# Patient Record
Sex: Female | Born: 1975 | Race: Black or African American | Hispanic: No | Marital: Married | Smoking: Never smoker
Health system: Southern US, Community
[De-identification: ages and names within clinical notes are randomized; demographics above are authoritative.]

## PROBLEM LIST (undated history)

## (undated) ENCOUNTER — Ambulatory Visit: Admission: EM | Payer: Managed Care, Other (non HMO) | Source: Home / Self Care

## (undated) DIAGNOSIS — R87629 Unspecified abnormal cytological findings in specimens from vagina: Secondary | ICD-10-CM

## (undated) DIAGNOSIS — Z803 Family history of malignant neoplasm of breast: Secondary | ICD-10-CM

## (undated) DIAGNOSIS — Z800 Family history of malignant neoplasm of digestive organs: Secondary | ICD-10-CM

## (undated) DIAGNOSIS — E079 Disorder of thyroid, unspecified: Secondary | ICD-10-CM

## (undated) DIAGNOSIS — Z8049 Family history of malignant neoplasm of other genital organs: Secondary | ICD-10-CM

## (undated) DIAGNOSIS — Z973 Presence of spectacles and contact lenses: Secondary | ICD-10-CM

## (undated) DIAGNOSIS — Z801 Family history of malignant neoplasm of trachea, bronchus and lung: Secondary | ICD-10-CM

## (undated) HISTORY — DX: Disorder of thyroid, unspecified: E07.9

## (undated) HISTORY — DX: Family history of malignant neoplasm of other genital organs: Z80.49

## (undated) HISTORY — DX: Family history of malignant neoplasm of trachea, bronchus and lung: Z80.1

## (undated) HISTORY — PX: CHOLECYSTECTOMY: SHX55

## (undated) HISTORY — DX: Unspecified abnormal cytological findings in specimens from vagina: R87.629

## (undated) HISTORY — DX: Family history of malignant neoplasm of breast: Z80.3

## (undated) HISTORY — PX: TUBAL LIGATION: SHX77

## (undated) HISTORY — PX: FLUOROSCOPIC TUBAL RECANNULATUON: SHX6654

## (undated) HISTORY — DX: Family history of malignant neoplasm of digestive organs: Z80.0

## (undated) HISTORY — PX: GALLBLADDER SURGERY: SHX652

---

## 1998-02-12 DIAGNOSIS — T8859XA Other complications of anesthesia, initial encounter: Secondary | ICD-10-CM

## 1998-02-12 HISTORY — DX: Other complications of anesthesia, initial encounter: T88.59XA

## 2007-02-13 HISTORY — PX: THYROIDECTOMY: SHX17

## 2008-02-13 HISTORY — PX: ABDOMINOPLASTY: SUR9

## 2011-02-13 DIAGNOSIS — G459 Transient cerebral ischemic attack, unspecified: Secondary | ICD-10-CM

## 2011-02-13 HISTORY — DX: Transient cerebral ischemic attack, unspecified: G45.9

## 2013-02-12 HISTORY — PX: ENDOMETRIAL ABLATION: SHX621

## 2017-06-26 DIAGNOSIS — E890 Postprocedural hypothyroidism: Secondary | ICD-10-CM | POA: Insufficient documentation

## 2017-06-26 DIAGNOSIS — Z6833 Body mass index (BMI) 33.0-33.9, adult: Secondary | ICD-10-CM | POA: Insufficient documentation

## 2017-06-26 DIAGNOSIS — Z9889 Other specified postprocedural states: Secondary | ICD-10-CM | POA: Insufficient documentation

## 2017-06-26 DIAGNOSIS — Z9049 Acquired absence of other specified parts of digestive tract: Secondary | ICD-10-CM | POA: Insufficient documentation

## 2017-07-02 DIAGNOSIS — N882 Stricture and stenosis of cervix uteri: Secondary | ICD-10-CM | POA: Insufficient documentation

## 2020-02-01 ENCOUNTER — Ambulatory Visit: Payer: Managed Care, Other (non HMO) | Admitting: Family Medicine

## 2020-02-01 ENCOUNTER — Encounter: Payer: Self-pay | Admitting: Family Medicine

## 2020-02-01 ENCOUNTER — Other Ambulatory Visit: Payer: Self-pay

## 2020-02-01 VITALS — BP 124/83 | HR 75 | Ht 62.0 in | Wt 187.0 lb

## 2020-02-01 DIAGNOSIS — N979 Female infertility, unspecified: Secondary | ICD-10-CM

## 2020-02-01 DIAGNOSIS — Z23 Encounter for immunization: Secondary | ICD-10-CM | POA: Diagnosis not present

## 2020-02-01 DIAGNOSIS — Z800 Family history of malignant neoplasm of digestive organs: Secondary | ICD-10-CM | POA: Diagnosis not present

## 2020-02-01 NOTE — Progress Notes (Signed)
    Subjective:    Patient ID: Kathryn Santos is a 44 y.o. female presenting with Establish Care  on 02/01/2020  HPI: She is a [id number] Here today to discuss fertility. She is s/p tubal reversal in 07/2019. She is also, s/p Novasure ablation in 2015 or so. She is having regular cycles that last 5 days. Her husband has no children. She might be interested in donor eggs. She has not had an HSG. Just turned 44. Has a lot of on-going nausea and symptoms of pregnancy. FSH, LH and AMH were all normal according to patient. Has been to infertility for tubal reversal. Was supposed to come in in December if not pregnant yet, for HSG.  Review of Systems  Constitutional: Negative for chills and fever.  Respiratory: Negative for shortness of breath.   Cardiovascular: Negative for chest pain.  Gastrointestinal: Negative for abdominal pain, nausea and vomiting.  Genitourinary: Negative for dysuria.  Skin: Negative for rash.      Objective:    BP 124/83   Pulse 75   Ht 5\' 2"  (1.575 m)   Wt 187 lb (84.8 kg)   LMP 01/26/2020 (Exact Date)   BMI 34.20 kg/m  Physical Exam Constitutional:      General: She is not in acute distress.    Appearance: She is well-developed and well-nourished.  HENT:     Head: Normocephalic and atraumatic.  Eyes:     General: No scleral icterus. Cardiovascular:     Rate and Rhythm: Normal rate.  Pulmonary:     Effort: Pulmonary effort is normal.  Abdominal:     Palpations: Abdomen is soft.  Musculoskeletal:     Cervical back: Neck supple.  Skin:    General: Skin is warm and dry.  Neurological:     Mental Status: She is alert and oriented to person, place, and time.  Psychiatric:        Mood and Affect: Mood and affect normal.         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 Discussed donor eggs.  She has an adult child who might be suitable.      Relevant Orders   DG Hysterogram (HSG)   Ambulatory referral to Endocrinology    Other Visit Diagnoses    Family history of colon cancer in father       Relevant Orders   Ambulatory referral to Gastroenterology   Need for influenza vaccination       Relevant Orders   Flu Vaccine QUAD 36+ mos IM (Fluarix, Quad PF) (Completed)      Total time in review of prior notes, pathology, labs, history taking, review with patient, exam, note writing, discussion of options, plan for next steps, alternatives and risks of treatment: 55 minutes.  Return in about 3 months (around 05/01/2020).  Donnamae Jude 02/02/2020 3:29 PM

## 2020-02-01 NOTE — Progress Notes (Signed)
Discuss getting pregnant and establish care Had a tubal reversal in June 2021 Has a uterine ablation  Last OB gave her clomid x 2 months   Last pap 2021-WNL mammo 2021

## 2020-02-02 ENCOUNTER — Encounter: Payer: Self-pay | Admitting: Family Medicine

## 2020-02-02 DIAGNOSIS — N979 Female infertility, unspecified: Secondary | ICD-10-CM | POA: Insufficient documentation

## 2020-02-02 NOTE — Assessment & Plan Note (Addendum)
Discussed age, risks of endometrial ablation, will check HSG, and try to eval cavity. Discussed donor eggs. She has an adult child who might be suitable.

## 2020-02-22 ENCOUNTER — Other Ambulatory Visit: Payer: Managed Care, Other (non HMO)

## 2020-02-22 ENCOUNTER — Other Ambulatory Visit: Payer: Self-pay

## 2020-02-22 DIAGNOSIS — Z3201 Encounter for pregnancy test, result positive: Secondary | ICD-10-CM

## 2020-02-23 LAB — BETA HCG QUANT (REF LAB): hCG Quant: <1 m[IU]/mL

## 2020-02-24 ENCOUNTER — Other Ambulatory Visit: Payer: Managed Care, Other (non HMO)

## 2020-02-24 ENCOUNTER — Other Ambulatory Visit: Payer: Self-pay

## 2020-02-24 ENCOUNTER — Encounter: Payer: Self-pay | Admitting: Radiology

## 2020-02-24 DIAGNOSIS — Z3201 Encounter for pregnancy test, result positive: Secondary | ICD-10-CM

## 2020-02-25 LAB — BETA HCG QUANT (REF LAB): hCG Quant: <1 m[IU]/mL

## 2020-03-01 ENCOUNTER — Encounter: Payer: Self-pay | Admitting: Family Medicine

## 2020-03-02 ENCOUNTER — Ambulatory Visit
Admission: RE | Admit: 2020-03-02 | Discharge: 2020-03-02 | Disposition: A | Discharge status: Home / Self Care | Payer: Managed Care, Other (non HMO) | Source: Ambulatory Visit | Attending: Family Medicine | Admitting: Family Medicine

## 2020-03-02 DIAGNOSIS — N979 Female infertility, unspecified: Secondary | ICD-10-CM

## 2020-03-16 DIAGNOSIS — Z8759 Personal history of other complications of pregnancy, childbirth and the puerperium: Secondary | ICD-10-CM | POA: Insufficient documentation

## 2020-03-16 DIAGNOSIS — Z8632 Personal history of gestational diabetes: Secondary | ICD-10-CM | POA: Insufficient documentation

## 2020-03-21 DIAGNOSIS — Z8759 Personal history of other complications of pregnancy, childbirth and the puerperium: Secondary | ICD-10-CM | POA: Insufficient documentation

## 2020-03-21 DIAGNOSIS — Z8719 Personal history of other diseases of the digestive system: Secondary | ICD-10-CM | POA: Insufficient documentation

## 2020-04-07 ENCOUNTER — Encounter: Payer: Self-pay | Admitting: Gastroenterology

## 2020-04-07 ENCOUNTER — Telehealth: Payer: Self-pay

## 2020-04-07 ENCOUNTER — Ambulatory Visit (INDEPENDENT_AMBULATORY_CARE_PROVIDER_SITE_OTHER): Payer: Managed Care, Other (non HMO) | Admitting: Gastroenterology

## 2020-04-07 VITALS — BP 128/90 | HR 89 | Ht 62.0 in | Wt 184.0 lb

## 2020-04-07 DIAGNOSIS — Z800 Family history of malignant neoplasm of digestive organs: Secondary | ICD-10-CM | POA: Diagnosis not present

## 2020-04-07 DIAGNOSIS — R140 Abdominal distension (gaseous): Secondary | ICD-10-CM | POA: Diagnosis not present

## 2020-04-07 DIAGNOSIS — R194 Change in bowel habit: Secondary | ICD-10-CM | POA: Diagnosis not present

## 2020-04-07 DIAGNOSIS — R110 Nausea: Secondary | ICD-10-CM

## 2020-04-07 MED ORDER — DICYCLOMINE HCL 10 MG PO CAPS: 10.0000 mg | ORAL_CAPSULE | Freq: Four times a day (QID) | ORAL | 1 refills | Status: DC | PRN

## 2020-04-07 MED ORDER — PANTOPRAZOLE SODIUM 40 MG PO TBEC: 40.0000 mg | DELAYED_RELEASE_TABLET | Freq: Every day | ORAL | 2 refills | Status: DC

## 2020-04-07 NOTE — Progress Notes (Signed)
Referring Provider: Donnamae Jude, MD Primary Care Physician:  Patient, No Pcp Per  Reason for Consultation:  Family history of colon cancer   IMPRESSION:  Family history of colon cancer (father at age 45) Near chronic nausea, bloating, and altered bowel habits  Cholecystectomy for symptomatic gallstones 2001  Colonoscopy recommended given the family history.    Differential for other symptoms is broad and includes peptic ulcer disease, gastroesophageal reflux, medications (nonsteroidal anti-inflammatory agents being the most common offender), biliary pain, gastroparesis, pancreatitis, carbohydrate malabsorption, celiac, bacterial overgrowth. Malignancy is less likely. Must also consider functional dyspepsia.     PLAN: Add a daily stool bulking agent such as Metamucil or Benefiber Trial of pantoprazole 40 mg QAM Dicyclomine 10 mg QID PRN Obtain prior records from [facility] in [place] EGD after resuming gluten free diet for at least 2 weeks Colonoscopy  Please see the "Patient Instructions" section for addition details about the plan.  HPI: Kathryn Santos is a 45 y.o. female referred by Dr. Kennon Rounds for a family history of colon cancer.  She has a history of hypothyroidism, nephrolithiasis, TIA, and cholecystectomy for symptomatic gallstones in 2001.  She is a Education officer, museum in [place]. Recently moved to [place].     Father diagnosed with colon cancer at age 21.  She had a colonoscopy in 2005 in [place], [place]. Thinks she may have had another one 5 years ago. She feels it's time for another colonoscopy.  Reports near constant nausea with associated bloating for at least 3 years. Awakes with symptoms, any then they occur intermittently throughout the day. Removed gluten with some improvement. Has tried an autoimmune diet. Has resulted in eating more fruits and vegetables. With this change she notes alternating bowel habits from stools like pebbles, to normal  shape and form, while some days she has severe postprandial diarrhea with associated urgency. She has had several accidents. Reminds her of the symptoms that she intiially experienced after her cholecystectomy. No blood or mucous in the stool.   Symptoms often worse after eating greasy foods. Has limited dairy in the event that lactose intolerance is contributing. Red meat results in constipation. Reduced caffeine intake, as well.   Has been married for 3 years. Trying to get pregnant but the nausea and bloating has complicated her efforts. They have recently decided to stop trying.   Cholecystectomy in 2001. Remembers being told that her pancreas didn't look normal. However, I reviewed the images of a CT abd/pelvis without contrast 04/27/19 that shows moderate stool in the colon but was otherwise normal.   No other known family history of colon cancer or polyps. No family history of uterine/endometrial cancer, pancreatic cancer or gastric/stomach cancer.   Past Medical History:  Diagnosis Date  . Thyroid disease   . Vaginal Pap smear, abnormal     Past Surgical History:  Procedure Laterality Date  . ABLATION    . FLUOROSCOPIC TUBAL RECANNULATUON    . GALLBLADDER SURGERY    . THYROIDECTOMY  02/2007  . TUBAL LIGATION      Current Outpatient Medications  Medication Sig Dispense Refill  . co-enzyme Q-10 30 MG capsule Take 30 mg by mouth 3 (three) times daily.    . Cyanocobalamin (VITAMIN B-12) 6000 MCG SUBL Place under the tongue.    . dicyclomine (BENTYL) 10 MG capsule Take 1 capsule (10 mg total) by mouth 4 (four) times daily as needed for spasms. 30 capsule 1  . pantoprazole (PROTONIX) 40 MG tablet Take  1 tablet (40 mg total) by mouth daily. 30 tablet 2  . prenatal vitamin w/FE, FA (PRENATAL 1 + 1) 27-1 MG TABS tablet Take 1 tablet by mouth daily at 12 noon.    . pyridoxine (B-6) 100 MG tablet Take 100 mg by mouth daily.     No current facility-administered medications for this  visit.    Allergies as of 04/07/2020 - Review Complete 04/07/2020  Allergen Reaction Noted  . Dilaudid [hydromorphone] Itching and Nausea And Vomiting 02/01/2020  . Zithromax [azithromycin] Hives and Other (See Comments) 02/01/2020    Family History  Problem Relation Age of Onset  . Hypertension Paternal Grandfather   . Cancer Paternal Grandfather        lung  . Diabetes Maternal Grandmother   . Diabetes Maternal Grandfather   . Hypertension Father   . Colon cancer Father 43  . Diabetes Maternal Aunt   . Diabetes Maternal Uncle   . Hypertension Paternal Uncle     Social History   Socioeconomic History  . Marital status: Married    Spouse name: Not on file  . Number of children: Not on file  . Years of education: Not on file  . Highest education level: Not on file  Occupational History  . Not on file  Tobacco Use  . Smoking status: Never Smoker  . Smokeless tobacco: Never Used  Vaping Use  . Vaping Use: Never used  Substance and Sexual Activity  . Alcohol use: Not Currently  . Drug use: Not Currently  . Sexual activity: Yes    Birth control/protection: None  Other Topics Concern  . Not on file  Social History Narrative  . Not on file   Social Determinants of Health   Financial Resource Strain: Not on file  Food Insecurity: Not on file  Transportation Needs: Not on file  Physical Activity: Not on file  Stress: Not on file  Social Connections: Not on file  Intimate Partner Violence: Not on file    Review of Systems: 12 system ROS is negative except as noted above with the addition of fatigue and excessive urination.   Physical Exam: General:   Alert,  well-nourished, pleasant and cooperative in NAD Head:  Normocephalic and atraumatic. Eyes:  Sclera clear, no icterus.   Conjunctiva pink. Ears:  Normal auditory acuity. Nose:  No deformity, discharge,  or lesions. Mouth:  No deformity or lesions.   Neck:  Supple; no masses or thyromegaly. Lungs:  Clear  throughout to auscultation.   No wheezes. Heart:  Regular rate and rhythm; no murmurs. Abdomen:  Soft, nontender, nondistended, normal bowel sounds, no rebound or guarding. No hepatosplenomegaly.   Rectal:  Deferred  Msk:  Symmetrical. No boney deformities LAD: No inguinal or umbilical LAD Extremities:  No clubbing or edema. Neurologic:  Alert and  oriented x4;  grossly nonfocal Skin:  Intact without significant lesions or rashes. Psych:  Alert and cooperative. Normal mood and affect.   Jaman Aro L. Tarri Glenn, MD, MPH 04/07/2020, 5:33 PM

## 2020-04-07 NOTE — Patient Instructions (Addendum)
It was a pleasure to meet you today. Based on our discussion, I am providing you with my recommendations below:  RECOMMENDATION(S):   I would like for your to try Pantoprazole and Dicyclomine to help control your symptoms. In addition, please take a daily stool bulking agent such as Metamucil or Benefiber.   I would like for you to sign a medical records release for me to review your previous records from [place], [place].   Finally, I am recommending an endoscopy and a colonoscopy due to your family's history for colon cancer.  PRESCRIPTION MEDICATION(S):   We have sent the following medication(s) to your pharmacy:  . Dicyclomine - please take 10mg  by mouth 4 times daily AS NEEDED . Pantoprazole - please take 40mg  by mouth every morning  NOTE: If your medication(s) requires a PRIOR AUTHORIZATION, we will receive notification from your pharmacy. Once received, the process to submit for approval may take up to 7-10 business days. You will be contacted about any denials we have received from your insurance company as well as alternatives recommended by your provider.  OVER THE COUNTER MEDICATION(S):   Please purchase the following medications over the counter and take as directed:  Marland Kitchen Metamucil or Benefiber - please take according to package instructions every day  RECORDS:   We will obtain records from [facility] located in [place], [place]:   . You have been scheduled for an endoscopy and a colonoscopy. Please follow written instructions given to you at your visit today.   PREP:   . Please pick up your prep supplies at the pharmacy within the next 1-3 days.  INHALERS:   . If you use inhalers (even only as needed), please bring them with you on the day of your procedure.  COLONOSCOPY TIPS:  . To reduce nausea and dehydration, stay well hydrated for 3-4 days prior to the exam.  . To prevent skin/hemorrhoid irritation - prior to wiping, put  A&Dointment or vaseline on the toilet paper. Marland Kitchen Keep a towel or pad on the bed.  Marland Kitchen BEFORE STARTING YOUR PREP, drink  64oz of clear liquids in the morning. This will help to flush the colon and will ensure you are well hydrated!!!!  NOTE - This is in addition to the fluids required for to complete your prep. . Use of a flavored hard candy, such as grape Anise Salvo, can counteract some of the flavor of the prep and may prevent some nausea.   BMI:  . If you are age 39 or younger, your body mass index should be between 19-25. Your There is no height or weight on file to calculate BMI. If this is out of the aformentioned range listed, please consider follow up with your Primary Care Provider.   Thank you for trusting me with your gastrointestinal care!    Thornton Park, MD, MPH

## 2020-04-07 NOTE — Progress Notes (Signed)
Records Request  Provider and/or Facility: [place], [place]  Document: Signed ROI  ROI has been faxed successfully to [place], [place]. Document(s) and fax confirmation(s) have been placed in the faxed file for future reference.

## 2020-04-07 NOTE — Telephone Encounter (Signed)
Received incoming fax from [facility] they could not locate pt by the name requested. Called pt to inquire further and to determine if these records might be under a maiden name or other alias. LVM requesting returned call.

## 2020-04-07 NOTE — Progress Notes (Signed)
Received incoming fax from [facility] they could not locate pt by the name requested. Called pt to inquire further and to determine if these records might be under a maiden name or other alias. LVM requesting returned call.

## 2020-04-08 ENCOUNTER — Telehealth: Payer: Self-pay

## 2020-04-08 NOTE — Telephone Encounter (Signed)
Received message from Dr. Tarri Glenn asking me to call pt and to change time of procedure from 2pm to 1:30pm on 04/15/20. LVM requesting returned call.

## 2020-04-08 NOTE — Progress Notes (Signed)
SECOND ATTEMPT:  Called pt to inquire about maiden name. States he records should be found under last "THOMAS". Advised we will resubmit with this information. Verbalized acceptance and understanding.  Request refaxed with above maiden name. Will await records.

## 2020-04-08 NOTE — Telephone Encounter (Signed)
SECOND ATTEMPT:  Called pt to inquire about maiden name. States he records should be found under last "THOMAS". Advised we will resubmit with this information. Verbalized acceptance and understanding.

## 2020-04-11 NOTE — Telephone Encounter (Signed)
SECOND ATTEMPT:  At Dr. Tarri Glenn request, called Kathryn Santos to inquire if she would be able to come in 30 minutes sooner at 1:30pm. Kathryn Santos confirmed she would be able to do so. Appt time changed per Dr. Tarri Glenn request. Kathryn Santos is aware to arrive at 12:30pm for her 1:30pm appt.

## 2020-04-15 ENCOUNTER — Other Ambulatory Visit: Payer: Self-pay

## 2020-04-15 ENCOUNTER — Ambulatory Visit (AMBULATORY_SURGERY_CENTER): Payer: Managed Care, Other (non HMO) | Admitting: Gastroenterology

## 2020-04-15 ENCOUNTER — Encounter: Payer: Self-pay | Admitting: Gastroenterology

## 2020-04-15 VITALS — BP 145/72 | HR 67 | Temp 97.1°F | Resp 19 | Ht 62.0 in | Wt 184.0 lb

## 2020-04-15 DIAGNOSIS — Z800 Family history of malignant neoplasm of digestive organs: Secondary | ICD-10-CM

## 2020-04-15 DIAGNOSIS — R194 Change in bowel habit: Secondary | ICD-10-CM

## 2020-04-15 DIAGNOSIS — K319 Disease of stomach and duodenum, unspecified: Secondary | ICD-10-CM | POA: Diagnosis not present

## 2020-04-15 DIAGNOSIS — K219 Gastro-esophageal reflux disease without esophagitis: Secondary | ICD-10-CM | POA: Diagnosis not present

## 2020-04-15 DIAGNOSIS — R110 Nausea: Secondary | ICD-10-CM

## 2020-04-15 MED ORDER — SODIUM CHLORIDE 0.9 % IV SOLN: 500.0000 mL | Freq: Once | INTRAVENOUS | Status: DC

## 2020-04-15 NOTE — Patient Instructions (Signed)
Impression/Recommendations:  Resume previous dietl Continue present medications. Await pathology results.  Repeat colonoscopy in 5 years for surveillance.  YOU HAD AN ENDOSCOPIC PROCEDURE TODAY AT [facility]:   Refer to the procedure report that was given to you for any specific questions about what was found during the examination.  If the procedure report does not answer your questions, please call your gastroenterologist to clarify.  If you requested that your care partner not be given the details of your procedure findings, then the procedure report has been included in a sealed envelope for you to review at your convenience later.  YOU SHOULD EXPECT: Some feelings of bloating in the abdomen. Passage of more gas than usual.  Walking can help get rid of the air that was put into your GI tract during the procedure and reduce the bloating. If you had a lower endoscopy (such as a colonoscopy or flexible sigmoidoscopy) you may notice spotting of blood in your stool or on the toilet paper. If you underwent a bowel prep for your procedure, you may not have a normal bowel movement for a few days.  Please Note:  You might notice some irritation and congestion in your nose or some drainage.  This is from the oxygen used during your procedure.  There is no need for concern and it should clear up in a day or so.  SYMPTOMS TO REPORT IMMEDIATELY:   Following lower endoscopy (colonoscopy or flexible sigmoidoscopy):  Excessive amounts of blood in the stool  Significant tenderness or worsening of abdominal pains  Swelling of the abdomen that is new, acute  Fever of 100F or higher   Following upper endoscopy (EGD)  Vomiting of blood or coffee ground material  New chest pain or pain under the shoulder blades  Painful or persistently difficult swallowing  New shortness of breath  Fever of 100F or higher  Black, tarry-looking stools  For urgent or emergent issues, a  gastroenterologist can be reached at any hour by calling [phone]. Do not use MyChart messaging for urgent concerns.    DIET:  We do recommend a small meal at first, but then you may proceed to your regular diet.  Drink plenty of fluids but you should avoid alcoholic beverages for 24 hours.  ACTIVITY:  You should plan to take it easy for the rest of today and you should NOT DRIVE or use heavy machinery until tomorrow (because of the sedation medicines used during the test).    FOLLOW UP: Our staff will call the number listed on your records 48-72 hours following your procedure to check on you and address any questions or concerns that you may have regarding the information given to you following your procedure. If we do not reach you, we will leave a message.  We will attempt to reach you two times.  During this call, we will ask if you have developed any symptoms of COVID 19. If you develop any symptoms (ie: fever, flu-like symptoms, shortness of breath, cough etc.) before then, please call [phone].  If you test positive for Covid 19 in the 2 weeks post procedure, please call and report this information to [place].    If any biopsies were taken you will be contacted by phone or by letter within the next 1-3 weeks.  Please call us at [phone] if you have not heard about the biopsies in 3 weeks.    SIGNATURES/CONFIDENTIALITY: You and/or your care partner have signed paperwork which will  be entered into your electronic medical record.  These signatures attest to the fact that that the information above on your After Visit Summary has been reviewed and is understood.  Full responsibility of the confidentiality of this discharge information lies with you and/or your care-partner. 

## 2020-04-15 NOTE — Op Note (Signed)
[facility] Patient Name: Kathryn Santos Procedure Date: 04/15/2020 1:42 PM MRN: [id number] Endoscopist: Thornton Park MD, MD Age: 45 Referring MD:  Date of Birth: October 24, 1975 Gender: Female Account #: [id number] Procedure:                Upper GI endoscopy Indications:              Chronic nausea and bloating Medicines:                Monitored Anesthesia Care Procedure:                Pre-Anesthesia Assessment:                           - Prior to the procedure, a History and Physical                            was performed, and patient medications and                            allergies were reviewed. The patient's tolerance of                            previous anesthesia was also reviewed. The risks                            and benefits of the procedure and the sedation                            options and risks were discussed with the patient.                            All questions were answered, and informed consent                            was obtained. Prior Anticoagulants: The patient has                            taken no previous anticoagulant or antiplatelet                            agents. ASA Grade Assessment: III - A patient with                            severe systemic disease. After reviewing the risks                            and benefits, the patient was deemed in                            satisfactory condition to undergo the procedure.                           After obtaining informed consent, the endoscope was  passed under direct vision. Throughout the                            procedure, the patient's blood pressure, pulse, and                            oxygen saturations were monitored continuously. The                            Endoscope was introduced through the mouth, and                            advanced to the third part of duodenum. The upper                            GI endoscopy was  accomplished without difficulty.                            The patient tolerated the procedure well. Scope In: Scope Out: Findings:                 The examined esophagus was normal except for mild                            congestion at the z-line. Biopsies were taken with                            a cold forceps for histology. Estimated blood loss                            was minimal.                           Patchy mildly erythematous mucosa without bleeding                            was found in the gastric body. Biopsies were taken                            from the antrum, body, and fundus with a cold                            forceps for histology. Estimated blood loss was                            minimal.                           The examined duodenum was normal. Biopsies were                            taken with a cold forceps for histology. Estimated                            blood  loss was minimal.                           The cardia and gastric fundus were normal on                            retroflexion.                           The exam was otherwise without abnormality. Complications:            No immediate complications. Estimated blood loss:                            Minimal. Estimated Blood Loss:     Estimated blood loss was minimal. Impression:               - Normal esophagus. Biopsied.                           - Erythematous mucosa in the gastric body. Biopsied.                           - Normal examined duodenum. Biopsied.                           - The examination was otherwise normal. Recommendation:           - Patient has a contact number available for                            emergencies. The signs and symptoms of potential                            delayed complications were discussed with the                            patient. Return to normal activities tomorrow.                            Written discharge instructions were  provided to the                            patient.                           - Resume previous diet.                           - Continue present medications.                           - Await pathology results.                           - Proceed with colonoscopy as previously planned. Thornton Park MD, MD 04/15/2020 2:06:34 PM This report has been signed electronically.

## 2020-04-15 NOTE — Op Note (Signed)
[place] Patient Name: Kathryn Santos Procedure Date: 04/15/2020 1:42 PM MRN: [id number] Endoscopist: Thornton Park MD, MD Age: 45 Referring MD:  Date of Birth: 05-Aug-1975 Gender: Female Account #: [id number] Procedure:                Colonoscopy Indications:              Screening in patient at increased risk: Family                            history of 1st-degree relative with colorectal                            cancer before age 52 years, Incidental change in                            bowel habits noted                           Father with colon cancer at age 55 Medicines:                Monitored Anesthesia Care Procedure:                Pre-Anesthesia Assessment:                           - Prior to the procedure, a History and Physical                            was performed, and patient medications and                            allergies were reviewed. The patient's tolerance of                            previous anesthesia was also reviewed. The risks                            and benefits of the procedure and the sedation                            options and risks were discussed with the patient.                            All questions were answered, and informed consent                            was obtained. Prior Anticoagulants: The patient has                            taken no previous anticoagulant or antiplatelet                            agents. ASA Grade Assessment: III - A patient with  severe systemic disease. After reviewing the risks                            and benefits, the patient was deemed in                            satisfactory condition to undergo the procedure.                           After obtaining informed consent, the colonoscope                            was passed under direct vision. Throughout the                            procedure, the patient's blood pressure, pulse, and                             oxygen saturations were monitored continuously. The                            Olympus CF-HQ190 (#[id number]) Colonoscope was                            introduced through the anus and advanced to the 3                            cm into the ileum. The colonoscopy was performed                            without difficulty. The patient tolerated the                            procedure well. The quality of the bowel                            preparation was good. The terminal ileum, ileocecal                            valve, appendiceal orifice, and rectum were                            photographed. Scope In: 1:54:30 PM Scope Out: 2:04:35 PM Scope Withdrawal Time: 0 hours 8 minutes 37 seconds  Total Procedure Duration: 0 hours 10 minutes 5 seconds  Findings:                 The perianal and digital rectal examinations were                            normal.                           The colon (entire examined portion) appeared  normal. Biopsies were taken from the right colon,                            transverse colon, and left colon with a cold                            forceps for histology. Estimated blood loss was                            minimal.                           The exam was otherwise without abnormality on                            direct and retroflexion views. Complications:            No immediate complications. Estimated blood loss:                            Minimal. Estimated Blood Loss:     Estimated blood loss was minimal. Impression:               - The entire examined colon is normal. Biopsied.                           - No polyp or mass seen. Recommendation:           - Patient has a contact number available for                            emergencies. The signs and symptoms of potential                            delayed complications were discussed with the                            patient. Return to normal  activities tomorrow.                            Written discharge instructions were provided to the                            patient.                           - Resume previous diet.                           - Continue present medications.                           - Await pathology results.                           - Repeat colonoscopy in 5 years for surveillance  given the family history.                           - Emerging evidence supports eating a diet of                            fruits, vegetables, grains, calcium, and yogurt                            while reducing red meat and alcohol may reduce the                            risk of colon cancer.                           - Thank you for allowing me to be involved in your                            colon cancer prevention. Thornton Park MD, MD 04/15/2020 2:11:15 PM This report has been signed electronically.

## 2020-04-15 NOTE — Progress Notes (Signed)
1339 Robinul 0.1 mg IV given due large amount of secretions upon assessment.  MD made aware, vss 

## 2020-04-15 NOTE — Progress Notes (Signed)
Called to room to assist during endoscopic procedure.  Patient ID and intended procedure confirmed with present staff. Received instructions for my participation in the procedure from the performing physician.  

## 2020-04-19 ENCOUNTER — Encounter: Payer: Self-pay | Admitting: Obstetrics and Gynecology

## 2020-04-19 ENCOUNTER — Other Ambulatory Visit: Payer: Self-pay

## 2020-04-19 ENCOUNTER — Telehealth: Payer: Self-pay | Admitting: *Deleted

## 2020-04-19 ENCOUNTER — Ambulatory Visit (INDEPENDENT_AMBULATORY_CARE_PROVIDER_SITE_OTHER): Payer: Managed Care, Other (non HMO) | Admitting: Obstetrics and Gynecology

## 2020-04-19 VITALS — BP 117/81 | HR 93 | Wt 183.8 lb

## 2020-04-19 DIAGNOSIS — Z87898 Personal history of other specified conditions: Secondary | ICD-10-CM

## 2020-04-19 NOTE — Progress Notes (Signed)
Obstetrics and Gynecology Visit Return Patient Evaluation  Appointment Date: 04/19/2020  Primary Care Provider: Patient, No Pcp Per  OBGYN Clinic: [facility]  Chief Complaint: h/o RLQ pain  History of Present Illness:  Kathryn Santos is a 45 y.o. with above CC. Patient had RLQ pain that started before her period in January and then persisted until last week when she had her screening colonoscopy, which she states was normal, and then the patient has not come on since then. Pain was off and on. She did have a chemical pregnancy (+home UPT) that month  Patient would like to make sure that everything is okay. LMP for this month just ending  Review of Systems: as noted in the History of Present Illness.   Patient Active Problem List   Diagnosis Date Noted  . Infertility, female 02/02/2020  . Cervical os stenosis 07/02/2017  . Class 1 obesity due to excess calories without serious comorbidity with body mass index (BMI) of 33.0 to 33.9 in adult 06/26/2017  . History of cholecystectomy 06/26/2017  . 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 Current Outpatient Medications  Medication Sig Dispense Refill  . co-enzyme Q-10 30 MG capsule Take 30 mg by mouth 3 (three) times daily.    . Cyanocobalamin (VITAMIN B-12) 6000 MCG SUBL Place under the tongue.    . dicyclomine (BENTYL) 10 MG capsule Take 1 capsule (10 mg total) by mouth 4 (four) times daily as needed for spasms. 30 capsule 1  . pantoprazole (PROTONIX) 40 MG tablet Take 1 tablet (40 mg total) by mouth daily. 30 tablet 2  . prenatal vitamin w/FE, FA (PRENATAL 1 + 1) 27-1 MG TABS tablet Take 1 tablet by mouth daily at 12 noon.    . pyridoxine (B-6) 100 MG tablet Take 100 mg by mouth daily.     No current facility-administered medications for this visit.    Allergies: is allergic to dilaudid [hydromorphone] and zithromax  [azithromycin].  Physical Exam:  BP 117/81   Pulse 93   Wt 183 lb 12.8 oz (83.4 kg)   LMP 04/16/2020 (Exact Date)   BMI 33.62 kg/m  Body mass index is 33.62 kg/m. General appearance: Well nourished, well developed female in no acute distress.  Abdomen: diffusely non tender to palpation, non distended, and no masses, hernias Neuro/Psych:  Normal mood and affect.    Pelvic exam:  EGBUS, vaginal vault and cervix: within normal limits. Scant old blood in the vault Bimanual exam negative   Assessment: pt doing well  Plan: I told her if s/s persist to let us know. Patient not pursuing ART and is okay with pregnancy occurring naturally. I told her of risks of pregnancy and if she ever has a +UPT to call us for labs and u/s given her high risk of ectopic  RTC: PRN  Durene Romans MD Attending Center for [facility])

## 2020-04-19 NOTE — Progress Notes (Signed)
Pt c/o pelvic pain but states it has now subsided

## 2020-04-19 NOTE — Telephone Encounter (Signed)
  Follow up Call-  Call back number 04/15/2020  Post procedure Call Back phone  # [phone]  Permission to leave phone message Yes     Patient questions:  Do you have a fever, pain , or abdominal swelling? No. Pain Score  0 *  Have you tolerated food without any problems? Yes.    Have you been able to return to your normal activities? Yes.    Do you have any questions about your discharge instructions: Diet   No. Medications  No. Follow up visit  No.  Do you have questions or concerns about your Care? No.  Actions: * If pain score is 4 or above: No action needed, pain <4.  1. Have you developed a fever since your procedure? no  2.   Have you had an respiratory symptoms (SOB or cough) since your procedure? no  3.   Have you tested positive for COVID 19 since your procedure no  4.   Have you had any family members/close contacts diagnosed with the COVID 19 since your procedure?  no   If yes to any of these questions please route to Joylene John, RN and Joella Prince, RN

## 2020-04-26 ENCOUNTER — Other Ambulatory Visit: Payer: Self-pay

## 2020-04-26 ENCOUNTER — Telehealth: Payer: Self-pay | Admitting: Gastroenterology

## 2020-04-26 DIAGNOSIS — D132 Benign neoplasm of duodenum: Secondary | ICD-10-CM

## 2020-04-26 NOTE — Telephone Encounter (Signed)
Pt calling for path results, please advise

## 2020-04-26 NOTE — Telephone Encounter (Signed)
Inbound call from patient requesting a call back please to discuss pathology results.

## 2020-04-26 NOTE — Telephone Encounter (Signed)
Results reviewed with the patient by phone. 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 May schedule at patient convenience at the [facility].   We reviewed the findings of reflux and gastropathy. I counseled her to avoid all NSAIDs. And to start using pantoprazole 40 mg QAM every day. Up until now, she has not been consistent with adherence.   Will plan office visit after EGD. Thanks.

## 2020-06-17 ENCOUNTER — Ambulatory Visit (AMBULATORY_SURGERY_CENTER): Payer: Managed Care, Other (non HMO) | Admitting: Gastroenterology

## 2020-06-17 ENCOUNTER — Encounter: Payer: Self-pay | Admitting: Gastroenterology

## 2020-06-17 ENCOUNTER — Other Ambulatory Visit: Payer: Self-pay

## 2020-06-17 VITALS — BP 114/80 | HR 67 | Temp 96.6°F | Resp 15 | Ht 62.0 in | Wt 184.0 lb

## 2020-06-17 DIAGNOSIS — D132 Benign neoplasm of duodenum: Secondary | ICD-10-CM

## 2020-06-17 MED ORDER — SODIUM CHLORIDE 0.9 % IV SOLN: 500.0000 mL | Freq: Once | INTRAVENOUS | Status: DC

## 2020-06-17 NOTE — Op Note (Signed)
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 The patient's tolerance of                            previous anesthesia was also reviewed. The risks                            and benefits of the procedure and the sedation                            options and risks were discussed with the patient.                            All questions were answered, and informed consent                            was obtained. Prior Anticoagulants: The patient has                            taken no previous anticoagulant or antiplatelet                            agents. ASA Grade Assessment: II - A patient with                            mild systemic disease. After reviewing the risks                            and benefits, the patient was deemed in                            satisfactory condition to undergo the procedure.  After obtaining informed consent, the endoscope was                            passed under direct vision. Throughout the                            procedure, the patient's blood pressure, pulse, and                            oxygen saturations were monitored continuously. The                            Endoscope was introduced through the mouth, and                            advanced to the third  part of duodenum. The upper                            GI endoscopy was accomplished without difficulty.                            The patient tolerated the procedure well. Scope In: Scope Out: Findings:                 The Z-line was regular and was found 33 cm from the                            incisors.                           The examined esophagus was normal.                           The stomach was normal.                           A single 5 mm carpeted polyp with no bleeding was                            found in the second portion of the duodenum. The                            polyp was removed with a hot snare. Resection and                            retrieval were complete. Estimated blood loss: none.                           A single 2 mm carpeted polyp was found in the                            second portion of the duodenum. The polyp was  removed with a cold snare. Resection and retrieval                            were complete. Estimated blood loss: none.                           The cardia and gastric fundus were normal on                            retroflexion. Complications:            No immediate complications. Estimated blood loss:                            None. Estimated Blood Loss:     Estimated blood loss was minimal. Impression:               - Z-line regular, 33 cm from the incisors.                           - Normal esophagus.                           - Normal stomach.                           - A single duodenal polyp. Resected and retrieved.                           - A single duodenal polyp. Resected and retrieved. Recommendation:           - Patient has a contact number available for                            emergencies. The signs and symptoms of potential                            delayed complications were discussed with the                            patient. Return to normal activities tomorrow.                             Written discharge instructions were provided to the                            patient.                           - Resume previous diet.                           - Continue present medications.                           - Await pathology results.                           -  Repeat upper endoscopy in 1 year for surveillance.                           - Follow-up in the office to review these pathology                            results and confirm surveillance recommendations. Thornton Park MD, MD 06/17/2020 10:21:11 AM This report has been signed electronically.

## 2020-06-17 NOTE — Progress Notes (Signed)
Called to room to assist during endoscopic procedure.  Patient ID and intended procedure confirmed with present staff. Received instructions for my participation in the procedure from the performing physician.  

## 2020-06-17 NOTE — Patient Instructions (Signed)
Repeat Upper Endoscopy in 1 year for surveillance.   YOU HAD AN ENDOSCOPIC PROCEDURE TODAY AT [facility]:   Refer to the procedure report that was given to you for any specific questions about what was found during the examination.  If the procedure report does not answer your questions, please call your gastroenterologist to clarify.  If you requested that your care partner not be given the details of your procedure findings, then the procedure report has been included in a sealed envelope for you to review at your convenience later.  YOU SHOULD EXPECT: Some feelings of bloating in the abdomen. Passage of more gas than usual.  Walking can help get rid of the air that was put into your GI tract during the procedure and reduce the bloating. If you had a lower endoscopy (such as a colonoscopy or flexible sigmoidoscopy) you may notice spotting of blood in your stool or on the toilet paper. If you underwent a bowel prep for your procedure, you may not have a normal bowel movement for a few days.  Please Note:  You might notice some irritation and congestion in your nose or some drainage.  This is from the oxygen used during your procedure.  There is no need for concern and it should clear up in a day or so.  SYMPTOMS TO REPORT IMMEDIATELY:   Following upper endoscopy (EGD)  Vomiting of blood or coffee ground material  New chest pain or pain under the shoulder blades  Painful or persistently difficult swallowing  New shortness of breath  Fever of 100F or higher  Black, tarry-looking stools  For urgent or emergent issues, a gastroenterologist can be reached at any hour by calling [phone]. Do not use MyChart messaging for urgent concerns.    DIET:  We do recommend a small meal at first, but then you may proceed to your regular diet.  Drink plenty of fluids but you should avoid alcoholic beverages for 24 hours.  ACTIVITY:  You should plan to take it easy for the rest of  today and you should NOT DRIVE or use heavy machinery until tomorrow (because of the sedation medicines used during the test).    FOLLOW UP: Our staff will call the number listed on your records 48-72 hours following your procedure to check on you and address any questions or concerns that you may have regarding the information given to you following your procedure. If we do not reach you, we will leave a message.  We will attempt to reach you two times.  During this call, we will ask if you have developed any symptoms of COVID 19. If you develop any symptoms (ie: fever, flu-like symptoms, shortness of breath, cough etc.) before then, please call [phone].  If you test positive for Covid 19 in the 2 weeks post procedure, please call and report this information to [place].    If any biopsies were taken you will be contacted by phone or by letter within the next 1-3 weeks.  Please call us at [phone] if you have not heard about the biopsies in 3 weeks.    SIGNATURES/CONFIDENTIALITY: You and/or your care partner have signed paperwork which will be entered into your electronic medical record.  These signatures attest to the fact that that the information above on your After Visit Summary has been reviewed and is understood.  Full responsibility of the confidentiality of this discharge information lies with you and/or your care-partner.

## 2020-06-17 NOTE — Progress Notes (Signed)
Report to PACU, RN, vss, BBS= Clear.  

## 2020-06-17 NOTE — Progress Notes (Signed)
History reviewed today 

## 2020-06-21 ENCOUNTER — Telehealth: Payer: Self-pay | Admitting: *Deleted

## 2020-06-21 NOTE — Telephone Encounter (Signed)
  Follow up Call-  Call back number 06/17/2020 04/15/2020  Post procedure Call Back phone  # [phone]  Permission to leave phone message Yes Yes     Patient questions:  Do you have a fever, pain , or abdominal swelling? No. Pain Score  0 *  Have you tolerated food without any problems? Yes.    Have you been able to return to your normal activities? Yes.    Do you have any questions about your discharge instructions: Diet   No. Medications  No. Follow up visit  No.  Do you have questions or concerns about your Care? No.  Actions: * If pain score is 4 or above: No action needed, pain <4.  1. Have you developed a fever since your procedure? no  2.   Have you had an respiratory symptoms (SOB or cough) since your procedure? no  3.   Have you tested positive for COVID 19 since your procedure no  4.   Have you had any family members/close contacts diagnosed with the COVID 19 since your procedure?  no   If yes to any of these questions please route to Joylene John, RN and Joella Prince, RN

## 2020-06-28 ENCOUNTER — Other Ambulatory Visit: Payer: Self-pay

## 2020-06-28 ENCOUNTER — Ambulatory Visit (INDEPENDENT_AMBULATORY_CARE_PROVIDER_SITE_OTHER): Payer: Managed Care, Other (non HMO) | Admitting: Family Medicine

## 2020-06-28 ENCOUNTER — Encounter: Payer: Self-pay | Admitting: Family Medicine

## 2020-06-28 VITALS — BP 124/86 | HR 80 | Ht 62.0 in | Wt 182.0 lb

## 2020-06-28 DIAGNOSIS — E6609 Other obesity due to excess calories: Secondary | ICD-10-CM | POA: Diagnosis not present

## 2020-06-28 DIAGNOSIS — Z7689 Persons encountering health services in other specified circumstances: Secondary | ICD-10-CM | POA: Diagnosis not present

## 2020-06-28 DIAGNOSIS — Z8632 Personal history of gestational diabetes: Secondary | ICD-10-CM

## 2020-06-28 DIAGNOSIS — Z8639 Personal history of other endocrine, nutritional and metabolic disease: Secondary | ICD-10-CM | POA: Diagnosis not present

## 2020-06-28 DIAGNOSIS — Z6833 Body mass index (BMI) 33.0-33.9, adult: Secondary | ICD-10-CM

## 2020-06-28 NOTE — Patient Instructions (Addendum)
Why follow it? Research shows. . Those who follow the Mediterranean diet have a reduced risk of heart disease  . The diet is associated with a reduced incidence of Parkinson's and Alzheimer's diseases . People following the diet may have longer life expectancies and lower rates of chronic diseases  . The Dietary Guidelines for Americans recommends the Mediterranean diet as an eating plan to promote health and prevent disease  What Is the Mediterranean Diet?  . Healthy eating plan based on typical foods and recipes of Mediterranean-style cooking . The diet is primarily a plant based diet; these foods should make up a majority of meals   Starches - Plant based foods should make up a majority of meals - They are an important sources of vitamins, minerals, energy, antioxidants, and fiber - Choose whole grains, foods high in fiber and minimally processed items  - Typical grain sources include wheat, oats, barley, corn, brown rice, bulgar, farro, millet, polenta, couscous  - Various types of beans include chickpeas, lentils, fava beans, black beans, white beans   Fruits  Veggies - Large quantities of antioxidant rich fruits & veggies; 6 or more servings  - Vegetables can be eaten raw or lightly drizzled with oil and cooked  - Vegetables common to the traditional Mediterranean Diet include: artichokes, arugula, beets, broccoli, brussel sprouts, cabbage, carrots, celery, collard greens, cucumbers, eggplant, kale, leeks, lemons, lettuce, mushrooms, okra, onions, peas, peppers, potatoes, pumpkin, radishes, rutabaga, shallots, spinach, sweet potatoes, turnips, zucchini - Fruits common to the Mediterranean Diet include: apples, apricots, avocados, cherries, clementines, dates, figs, grapefruits, grapes, melons, nectarines, oranges, peaches, pears, pomegranates, strawberries, tangerines  Fats - Replace butter and margarine with healthy oils, such as olive oil, canola oil, and tahini  - Limit nuts to no  more than a handful a day  - Nuts include walnuts, almonds, pecans, pistachios, pine nuts  - Limit or avoid candied, honey roasted or heavily salted nuts - Olives are central to the Mediterranean diet - can be eaten whole or used in a variety of dishes   Meats Protein - Limiting red meat: no more than a few times a month - When eating red meat: choose lean cuts and keep the portion to the size of deck of cards - Eggs: approx. 0 to 4 times a week  - Fish and lean poultry: at least 2 a week  - Healthy protein sources include, chicken, turkey, lean beef, lamb - Increase intake of seafood such as tuna, salmon, trout, mackerel, shrimp, scallops - Avoid or limit high fat processed meats such as sausage and bacon  Dairy - Include moderate amounts of low fat dairy products  - Focus on healthy dairy such as fat free yogurt, skim milk, low or reduced fat cheese - Limit dairy products higher in fat such as whole or 2% milk, cheese, ice cream  Alcohol - Moderate amounts of red wine is ok  - No more than 5 oz daily for women (all ages) and men older than age 65  - No more than 10 oz of wine daily for men younger than 65  Other - Limit sweets and other desserts  - Use herbs and spices instead of salt to flavor foods  - Herbs and spices common to the traditional Mediterranean Diet include: basil, bay leaves, chives, cloves, cumin, fennel, garlic, lavender, marjoram, mint, oregano, parsley, pepper, rosemary, sage, savory, sumac, tarragon, thyme   It's not just a diet, it's a lifestyle:  . The Mediterranean diet includes   lifestyle factors typical of those in the region  . Foods, drinks and meals are best eaten with others and savored . Daily physical activity is important for overall good health . This could be strenuous exercise like running and aerobics . This could also be more leisurely activities such as walking, housework, yard-work, or taking the stairs . Moderation is the key; a balanced and  healthy diet accommodates most foods and drinks . Consider portion sizes and frequency of consumption of certain foods   Meal Ideas & Options:  . Breakfast:  o Whole wheat toast or whole wheat English muffins with peanut butter & hard boiled egg o Steel cut oats topped with apples & cinnamon and skim milk  o Fresh fruit: banana, strawberries, melon, berries, peaches  o Smoothies: strawberries, bananas, greek yogurt, peanut butter o Low fat greek yogurt with blueberries and granola  o Egg white omelet with spinach and mushrooms o Breakfast couscous: whole wheat couscous, apricots, skim milk, cranberries  . Sandwiches:  o Hummus and grilled vegetables (peppers, zucchini, squash) on whole wheat bread   o Grilled chicken on whole wheat pita with lettuce, tomatoes, cucumbers or tzatziki  o Tuna salad on whole wheat bread: tuna salad made with greek yogurt, olives, red peppers, capers, green onions o Garlic rosemary lamb pita: lamb sauted with garlic, rosemary, salt & pepper; add lettuce, cucumber, greek yogurt to pita - flavor with lemon juice and black pepper  . Seafood:  o Mediterranean grilled salmon, seasoned with garlic, basil, parsley, lemon juice and black pepper o Shrimp, lemon, and spinach whole-grain pasta salad made with low fat greek yogurt  o Seared scallops with lemon orzo  o Seared tuna steaks seasoned salt, pepper, coriander topped with tomato mixture of olives, tomatoes, olive oil, minced garlic, parsley, green onions and cappers  . Meats:  o Herbed greek chicken salad with kalamata olives, cucumber, feta  o Red bell peppers stuffed with spinach, bulgur, lean ground beef (or lentils) & topped with feta   o Kebabs: skewers of chicken, tomatoes, onions, zucchini, squash  o [place] burgers: made with red onions, mint, dill, lemon juice, feta cheese topped with roasted red peppers . Vegetarian o Cucumber salad: cucumbers, artichoke hearts, celery, red onion, feta cheese, tossed in  olive oil & lemon juice  o Hummus and whole grain pita points with a greek salad (lettuce, tomato, feta, olives, cucumbers, red onion) o Lentil soup with celery, carrots made with vegetable broth, garlic, salt and pepper  o Tabouli salad: parsley, bulgur, mint, scallions, cucumbers, tomato, radishes, lemon juice, olive oil, salt and pepper.      Managing Anxiety, Adult After being diagnosed with an anxiety disorder, you may be relieved to know why you have felt or behaved a certain way. You may also feel overwhelmed about the treatment ahead and what it will mean for your life. With care and support, you can manage this condition and recover from it. How to manage lifestyle changes Managing stress and anxiety Stress is your body's reaction to life changes and events, both good and bad. Most stress will last just a few hours, but stress can be ongoing and can lead to more than just stress. Although stress can play a major role in anxiety, it is not the same as anxiety. Stress is usually caused by something external, such as a deadline, test, or competition. Stress normally passes after the triggering event has ended.  Anxiety is caused by something internal, such as imagining a  terrible outcome or worrying that something will go wrong that will devastate you. Anxiety often does not go away even after the triggering event is over, and it can become long-term (chronic) worry. It is important to understand the differences between stress and anxiety and to manage your stress effectively so that it does not lead to an anxious response. Talk with your health care provider or a counselor to learn more about reducing anxiety and stress. He or she may suggest tension reduction techniques, such as:  Music therapy. This can include creating or listening to music that you enjoy and that inspires you.  Mindfulness-based meditation. This involves being aware of your normal breaths while not trying to control your  breathing. It can be done while sitting or walking.  Centering prayer. This involves focusing on a word, phrase, or sacred image that means something to you and brings you peace.  Deep breathing. To do this, expand your stomach and inhale slowly through your nose. Hold your breath for 3-5 seconds. Then exhale slowly, letting your stomach muscles relax.  Self-talk. This involves identifying thought patterns that lead to anxiety reactions and changing those patterns.  Muscle relaxation. This involves tensing muscles and then relaxing them. Choose a tension reduction technique that suits your lifestyle and personality. These techniques take time and practice. Set aside 5-15 minutes a day to do them. Therapists can offer counseling and training in these techniques. The training to help with anxiety may be covered by some insurance plans. Other things you can do to manage stress and anxiety include:  Keeping a stress/anxiety diary. This can help you learn what triggers your reaction and then learn ways to manage your response.  Thinking about how you react to certain situations. You may not be able to control everything, but you can control your response.  Making time for activities that help you relax and not feeling guilty about spending your time in this way.  Visual imagery and yoga can help you stay calm and relax.   Medicines Medicines can help ease symptoms. Medicines for anxiety include:  Anti-anxiety drugs.  Antidepressants. Medicines are often used as a primary treatment for anxiety disorder. Medicines will be prescribed by a health care provider. When used together, medicines, psychotherapy, and tension reduction techniques may be the most effective treatment. Relationships Relationships can play a big part in helping you recover. Try to spend more time connecting with trusted friends and family members. Consider going to couples counseling, taking family education classes, or going  to family therapy. Therapy can help you and others better understand your condition. How to recognize changes in your anxiety Everyone responds differently to treatment for anxiety. Recovery from anxiety happens when symptoms decrease and stop interfering with your daily activities at home or work. This may mean that you will start to:  Have better concentration and focus. Worry will interfere less in your daily thinking.  Sleep better.  Be less irritable.  Have more energy.  Have improved memory. It is important to recognize when your condition is getting worse. Contact your health care provider if your symptoms interfere with home or work and you feel like your condition is not improving. Follow these instructions at home: Activity  Exercise. Most adults should do the following: ? Exercise for at least 150 minutes each week. The exercise should increase your heart rate and make you sweat (moderate-intensity exercise). ? Strengthening exercises at least twice a week.  Get the right amount and quality of  sleep. Most adults need 7-9 hours of sleep each night. Lifestyle  Eat a healthy diet that includes plenty of vegetables, fruits, whole grains, low-fat dairy products, and lean protein. Do not eat a lot of foods that are high in solid fats, added sugars, or salt.  Make choices that simplify your life.  Do not use any products that contain nicotine or tobacco, such as cigarettes, e-cigarettes, and chewing tobacco. If you need help quitting, ask your health care provider.  Avoid caffeine, alcohol, and certain over-the-counter cold medicines. These may make you feel worse. Ask your pharmacist which medicines to avoid.   General instructions  Take over-the-counter and prescription medicines only as told by your health care provider.  Keep all follow-up visits as told by your health care provider. This is important. Where to find support You can get help and support from these  sources:  Self-help groups.  Online and [employer].  A trusted spiritual leader.  Couples counseling.  Family education classes.  Family therapy. Where to find more information You may find that joining a support group helps you deal with your anxiety. The following sources can help you locate counselors or support groups near you:  [place]: www.mentalhealth[place].net  Anxiety and Depression Association of [place] (ADAA): https://www.clark.net/  National Alliance on Mental Illness (NAMI): www.nami.org Contact a health care provider if you:  Have a hard time staying focused or finishing daily tasks.  Spend many hours a day feeling worried about everyday life.  Become exhausted by worry.  Start to have headaches, feel tense, or have nausea.  Urinate more than normal.  Have diarrhea. Get help right away if you have:  A racing heart and shortness of breath.  Thoughts of hurting yourself or others. If you ever feel like you may hurt yourself or others, or have thoughts about taking your own life, get help right away. You can go to your nearest emergency department or call:  Your local emergency services (911 in the [place]  A suicide crisis helpline, such as the [place] at 336-774-8310. This is open 24 hours a day. Summary  Taking steps to learn and use tension reduction techniques can help calm you and help prevent triggering an anxiety reaction.  When used together, medicines, psychotherapy, and tension reduction techniques may be the most effective treatment.  Family, friends, and partners can play a big part in helping you recover from an anxiety disorder. This information is not intended to replace advice given to you by your health care provider. Make sure you discuss any questions you have with your health care provider. Document Revised: 07/01/2018 Document Reviewed: 07/01/2018 Elsevier Patient Education  [place].

## 2020-06-28 NOTE — Progress Notes (Signed)
Date:  06/28/2020   Name:  Kathryn Santos   DOB:  1975/07/25   MRN:  [id number]   Chief Complaint: Establish Care (Needed pcp- moved to area)  Patient is a 45 year old female who presents for a establish care exam. The patient reports the following problems: none. Health maintenance has been reviewed up to date.   No results found for: CREATININE, BUN, NA, K, CL, CO2 No results found for: CHOL, HDL, LDLCALC, LDLDIRECT, TRIG, CHOLHDL No results found for: TSH No results found for: HGBA1C No results found for: WBC, HGB, HCT, MCV, PLT No results found for: ALT, AST, GGT, ALKPHOS, BILITOT   Review of Systems  Constitutional: Negative.  Negative for chills, fatigue, fever and unexpected weight change.  HENT: Negative for congestion, ear discharge, ear pain, mouth sores, nosebleeds, rhinorrhea, sinus pressure, sneezing and sore throat.   Eyes: Positive for visual disturbance. Negative for photophobia, pain, discharge, redness and itching.  Respiratory: Negative for cough, shortness of breath, wheezing and stridor.   Cardiovascular: Negative for chest pain, palpitations and leg swelling.  Gastrointestinal: Negative for abdominal pain, blood in stool, constipation, diarrhea, nausea and vomiting.  Endocrine: Negative for cold intolerance, heat intolerance, polydipsia, polyphagia and polyuria.  Genitourinary: Negative for dysuria, flank pain, frequency, hematuria, menstrual problem, pelvic pain, urgency, vaginal bleeding and vaginal discharge.  Musculoskeletal: Negative for arthralgias, back pain and myalgias.  Skin: Negative for rash.  Allergic/Immunologic: Negative for environmental allergies and food allergies.  Neurological: Negative for dizziness, weakness, light-headedness, numbness and headaches.  Hematological: Negative for adenopathy. Does not bruise/bleed easily.  Psychiatric/Behavioral: Negative for dysphoric mood. The patient is not nervous/anxious.     Patient Active Problem  List   Diagnosis Date Noted  . Infertility, female 02/02/2020  . Cervical os stenosis 07/02/2017  . Class 1 obesity due to excess calories without serious comorbidity with body mass index (BMI) of 33.0 to 33.9 in adult 06/26/2017  . History of cholecystectomy 06/26/2017  . 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 Dilaudid [Hydromorphone] Itching and Nausea And Vomiting  . Zithromax [Azithromycin] Hives and Other (See Comments)    Stiffness in face    Past Surgical History:  Procedure Laterality Date  . CHOLECYSTECTOMY    . ENDOMETRIAL ABLATION  2015  . FLUOROSCOPIC TUBAL RECANNULATUON    . GALLBLADDER SURGERY    . THYROIDECTOMY  02/2007  . TUBAL LIGATION      Social History   Tobacco Use  . Smoking status: Never Smoker  . Smokeless tobacco: Never Used  Vaping Use  . Vaping Use: Never used  Substance Use Topics  . Alcohol use: Not Currently  . Drug use: Not Currently     Medication list has been reviewed and updated.  Current Meds  Medication Sig  . Cyanocobalamin (VITAMIN B-12) 6000 MCG SUBL Place under the tongue.  . pantoprazole (PROTONIX) 40 MG tablet Take 1 tablet (40 mg total) by mouth daily.  . prenatal vitamin w/FE, FA (PRENATAL 1 + 1) 27-1 MG TABS tablet Take 1 tablet by mouth daily at 12 noon.  . pyridoxine (B-6) 100 MG tablet Take 100 mg by mouth daily.   Current Facility-Administered Medications for the 06/28/20 encounter (Office Visit) with Juline Patch, MD  Medication  . 0.9 %  sodium chloride infusion    PHQ 2/9 Scores 06/28/2020  PHQ - 2 Score 0  PHQ- 9 Score 1    GAD 7 : Generalized Anxiety Score  06/28/2020  Nervous, Anxious, on Edge 1  Control/stop worrying 1  Worry too much - different things 0  Trouble relaxing 0  Restless 0  Easily annoyed or irritable 0  Afraid - awful might happen 0  Total GAD 7 Score 2  Anxiety Difficulty Not difficult at all    BP Readings from Last 3 Encounters:   06/28/20 124/86  06/17/20 114/80  04/19/20 117/81    Physical Exam Vitals and nursing note reviewed.  Constitutional:      Appearance: Normal appearance. She is well-developed.  HENT:     Head: Normocephalic.     Right Ear: Tympanic membrane, ear canal and external ear normal. There is no impacted cerumen.     Left Ear: Tympanic membrane, ear canal and external ear normal. There is no impacted cerumen.     Nose: Nose normal. No congestion or rhinorrhea.     Mouth/Throat:     Mouth: Mucous membranes are moist.     Pharynx: No oropharyngeal exudate or posterior oropharyngeal erythema.  Eyes:     General: Lids are everted, no foreign bodies appreciated. No scleral icterus.       Left eye: No foreign body or hordeolum.     Extraocular Movements: Extraocular movements intact.     Conjunctiva/sclera: Conjunctivae normal.     Right eye: Right conjunctiva is not injected.     Left eye: Left conjunctiva is not injected.     Pupils: Pupils are equal, round, and reactive to light.  Neck:     Thyroid: No thyromegaly.     Vascular: No JVD.     Trachea: No tracheal deviation.  Cardiovascular:     Rate and Rhythm: Regular rhythm. Tachycardia present.     Heart sounds: Normal heart sounds. No murmur heard. No friction rub. No gallop.   Pulmonary:     Effort: Pulmonary effort is normal. No respiratory distress.     Breath sounds: Normal breath sounds. No wheezing, rhonchi or rales.  Abdominal:     General: Abdomen is flat. Bowel sounds are normal.     Palpations: Abdomen is soft. There is no mass.     Tenderness: There is no abdominal tenderness. There is no guarding or rebound.  Musculoskeletal:        General: No tenderness. Normal range of motion.     Cervical back: Normal range of motion and neck supple.  Lymphadenopathy:     Cervical: No cervical adenopathy.  Skin:    General: Skin is warm.     Findings: No rash.  Neurological:     Mental Status: She is alert and oriented to  person, place, and time.     Cranial Nerves: No cranial nerve deficit.     Deep Tendon Reflexes: Reflexes normal.  Psychiatric:        Mood and Affect: Mood is not anxious or depressed.     Wt Readings from Last 3 Encounters:  06/28/20 182 lb (82.6 kg)  06/17/20 184 lb (83.5 kg)  04/19/20 183 lb 12.8 oz (83.4 kg)    BP 124/86   Pulse 80   Ht 5\' 2"  (1.575 m)   Wt 182 lb (82.6 kg)   LMP 06/08/2020   BMI 33.29 kg/m   Assessment and Plan:  1. Establishing care with new doctor, encounter for Patient establishing care with new physician.  With no specific concerns.  2. History of gestational diabetes Patient with a history of gestational diabetes with 1 mild elevated glucose reading  in the recent past.  We will recheck in the not too distant future.  3. History of hyperlipidemia Mild elevation of LDL in the past we will recheck lipid panel and the not too distant future.  4. Class 1 obesity due to excess calories without serious comorbidity with body mass index (BMI) of 33.0 to 33.9 in adult Patient with a BMI over 33 and has been given a Mediterranean diet.

## 2020-07-03 ENCOUNTER — Other Ambulatory Visit: Payer: Self-pay | Admitting: Gastroenterology

## 2020-07-03 DIAGNOSIS — R140 Abdominal distension (gaseous): Secondary | ICD-10-CM

## 2020-07-03 DIAGNOSIS — R194 Change in bowel habit: Secondary | ICD-10-CM

## 2020-07-03 DIAGNOSIS — Z800 Family history of malignant neoplasm of digestive organs: Secondary | ICD-10-CM

## 2020-07-03 DIAGNOSIS — R110 Nausea: Secondary | ICD-10-CM

## 2020-07-08 ENCOUNTER — Other Ambulatory Visit: Payer: Self-pay

## 2020-07-08 ENCOUNTER — Encounter: Payer: Self-pay | Admitting: Family Medicine

## 2020-07-08 ENCOUNTER — Emergency Department
Admission: EM | Admit: 2020-07-08 | Discharge: 2020-07-08 | Disposition: A | Discharge status: Home / Self Care | Payer: Managed Care, Other (non HMO) | Attending: Emergency Medicine | Admitting: Emergency Medicine

## 2020-07-08 ENCOUNTER — Encounter: Payer: Self-pay | Admitting: Emergency Medicine

## 2020-07-08 DIAGNOSIS — F411 Generalized anxiety disorder: Secondary | ICD-10-CM | POA: Diagnosis not present

## 2020-07-08 DIAGNOSIS — R002 Palpitations: Secondary | ICD-10-CM | POA: Diagnosis present

## 2020-07-08 DIAGNOSIS — E890 Postprocedural hypothyroidism: Secondary | ICD-10-CM | POA: Insufficient documentation

## 2020-07-08 DIAGNOSIS — F4310 Post-traumatic stress disorder, unspecified: Secondary | ICD-10-CM | POA: Diagnosis present

## 2020-07-08 LAB — BASIC METABOLIC PANEL
Anion gap: 10 (ref 5–15)
BUN: 13 mg/dL (ref 6–20)
CO2: 22 mmol/L (ref 22–32)
Calcium: 8.7 mg/dL — ABNORMAL LOW (ref 8.9–10.3)
Chloride: 105 mmol/L (ref 98–111)
Creatinine, Ser: 0.93 mg/dL (ref 0.44–1.00)
GFR, Estimated: >60 mL/min (ref >60)
Glucose, Bld: 130 mg/dL — ABNORMAL HIGH (ref 70–99)
Potassium: 3.2 mmol/L — ABNORMAL LOW (ref 3.5–5.1)
Sodium: 137 mmol/L (ref 135–145)

## 2020-07-08 LAB — CBC WITH DIFFERENTIAL/PLATELET
Abs Immature Granulocytes: 0.01 10*3/uL (ref 0.00–0.07)
Basophils Absolute: 0 10*3/uL (ref 0.0–0.1)
Basophils Relative: 1 %
Eosinophils Absolute: 0 10*3/uL (ref 0.0–0.5)
Eosinophils Relative: 1 %
HCT: 38.8 % (ref 36.0–46.0)
Hemoglobin: 13.2 g/dL (ref 12.0–15.0)
Immature Granulocytes: 0 %
Lymphocytes Relative: 43 %
Lymphs Abs: 1.9 10*3/uL (ref 0.7–4.0)
MCH: 30.4 pg (ref 26.0–34.0)
MCHC: 34 g/dL (ref 30.0–36.0)
MCV: 89.4 fL (ref 80.0–100.0)
Monocytes Absolute: 0.3 10*3/uL (ref 0.1–1.0)
Monocytes Relative: 6 %
Neutro Abs: 2.2 10*3/uL (ref 1.7–7.7)
Neutrophils Relative %: 49 %
Platelets: 295 10*3/uL (ref 150–400)
RBC: 4.34 MIL/uL (ref 3.87–5.11)
RDW: 11.8 % (ref 11.5–15.5)
WBC: 4.4 10*3/uL (ref 4.0–10.5)
nRBC: 0 % (ref 0.0–0.2)

## 2020-07-08 LAB — TROPONIN I (HIGH SENSITIVITY): Troponin I (High Sensitivity): <2 ng/L (ref <18)

## 2020-07-08 LAB — MAGNESIUM: Magnesium: 2 mg/dL (ref 1.7–2.4)

## 2020-07-08 MED ORDER — HYDROXYZINE HCL 25 MG PO TABS
25.0000 mg | ORAL_TABLET | Freq: Once | ORAL | Status: AC
  Administered 2020-07-08: 25 mg via ORAL
  Filled 2020-07-08: qty 1

## 2020-07-08 MED ORDER — GABAPENTIN 100 MG PO CAPS: 100.0000 mg | ORAL_CAPSULE | Freq: Three times a day (TID) | ORAL | Status: DC | PRN

## 2020-07-08 MED ORDER — GABAPENTIN 100 MG PO CAPS: 100.0000 mg | ORAL_CAPSULE | Freq: Three times a day (TID) | ORAL | 2 refills | Status: DC | PRN

## 2020-07-08 MED ORDER — HYDROXYZINE HCL 10 MG PO TABS
10.0000 mg | ORAL_TABLET | Freq: Three times a day (TID) | ORAL | Status: DC | PRN
  Filled 2020-07-08: qty 1

## 2020-07-08 MED ORDER — HYDROXYZINE HCL 10 MG PO TABS: 10.0000 mg | ORAL_TABLET | Freq: Three times a day (TID) | ORAL | 2 refills | Status: DC | PRN
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
## 2020-07-08 NOTE — ED Notes (Signed)
Pt alert and oriented X 4, stable for discharge. RR even and unlabored, color WNL. Discussed discharge instructions and follow-up as directed. Discharge medications discussed if prescribed. Pt had opportunity to ask questions, and RN to provide patient/family eduction.  
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
## 2020-07-08 NOTE — ED Notes (Addendum)
Given lunch tray.

## 2020-07-29 ENCOUNTER — Telehealth: Payer: Self-pay | Admitting: Licensed Clinical Social Worker

## 2020-07-29 ENCOUNTER — Other Ambulatory Visit: Payer: Self-pay

## 2020-07-29 DIAGNOSIS — Z800 Family history of malignant neoplasm of digestive organs: Secondary | ICD-10-CM

## 2020-07-29 DIAGNOSIS — D132 Benign neoplasm of duodenum: Secondary | ICD-10-CM

## 2020-07-29 DIAGNOSIS — D369 Benign neoplasm, unspecified site: Secondary | ICD-10-CM

## 2020-07-29 NOTE — Telephone Encounter (Signed)
Scheduled appt per 6/17 referral. Pt aware.

## 2020-08-05 ENCOUNTER — Other Ambulatory Visit: Payer: Self-pay

## 2020-08-05 DIAGNOSIS — D369 Benign neoplasm, unspecified site: Secondary | ICD-10-CM

## 2020-08-05 DIAGNOSIS — Z800 Family history of malignant neoplasm of digestive organs: Secondary | ICD-10-CM

## 2020-08-05 DIAGNOSIS — D132 Benign neoplasm of duodenum: Secondary | ICD-10-CM

## 2020-08-08 ENCOUNTER — Inpatient Hospital Stay: Payer: Managed Care, Other (non HMO) | Attending: Family Medicine | Admitting: Licensed Clinical Social Worker

## 2020-08-08 ENCOUNTER — Inpatient Hospital Stay: Payer: Managed Care, Other (non HMO)

## 2020-08-08 ENCOUNTER — Encounter: Payer: Self-pay | Admitting: Licensed Clinical Social Worker

## 2020-08-08 ENCOUNTER — Other Ambulatory Visit: Payer: Self-pay

## 2020-08-08 DIAGNOSIS — Z8049 Family history of malignant neoplasm of other genital organs: Secondary | ICD-10-CM

## 2020-08-08 DIAGNOSIS — Z803 Family history of malignant neoplasm of breast: Secondary | ICD-10-CM

## 2020-08-08 DIAGNOSIS — Z801 Family history of malignant neoplasm of trachea, bronchus and lung: Secondary | ICD-10-CM | POA: Insufficient documentation

## 2020-08-08 DIAGNOSIS — Z800 Family history of malignant neoplasm of digestive organs: Secondary | ICD-10-CM | POA: Diagnosis not present

## 2020-08-08 NOTE — Progress Notes (Signed)
REFERRING PROVIDER: Thornton Park, MD [address] [place],  [place] [address]   PRIMARY PROVIDER:  Juline Patch, MD  PRIMARY REASON FOR VISIT:  1. Family history of colon cancer   2. Family history of lung cancer   3. Family history of breast cancer   4. Family history of cervical cancer      HISTORY OF PRESENT ILLNESS:   Kathryn Santos, a 45 y.o. female, was seen for a [facility] cancer genetics consultation at the request of Dr. Tarri Glenn due to a family history of cancer and personal history of duodenal polyps.  Kathryn Santos presents to clinic today to discuss the possibility of a hereditary predisposition to cancer, genetic testing, and to further clarify her future cancer risks, as well as potential cancer risks for family members.   Kathryn Santos is a 45 y.o. female with no personal history of cancer.  She had half of thyroid removed in her 30s and precancerous cells were found. She also had a surgery/biopsy of her perineum in 2009 that was also precancerous. 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   CANCER HISTORY:  Oncology History   No history exists.     RISK FACTORS:  Menarche was at age 74.  First live birth at age 36.  OCP use for approximately 1 years.  Ovaries intact: yes.  Hysterectomy: no.  Menopausal status: premenopausal.  HRT use: 0 years. Colonoscopy: yes;  duodenal adenomas . Mammogram within the last year: yes. Number of breast biopsies: 0.   Past Medical History:  Diagnosis Date   Family history of breast cancer    Family history of cervical cancer    Family history of colon cancer    Family history of lung cancer    Thyroid disease    TIA (transient ischemic attack) 2013   Vaginal Pap smear, abnormal     Past Surgical History:  Procedure Laterality Date   CHOLECYSTECTOMY     ENDOMETRIAL ABLATION  2015   FLUOROSCOPIC TUBAL RECANNULATUON     GALLBLADDER SURGERY     THYROIDECTOMY  02/2007   TUBAL LIGATION      Social  History   Socioeconomic History   Marital status: Married    Spouse name: Not on file   Number of children: Not on file   Years of education: Not on file   Highest education level: Not on file  Occupational History   Not on file  Tobacco Use   Smoking status: Never   Smokeless tobacco: Never  Vaping Use   Vaping Use: Never used  Substance and Sexual Activity   Alcohol use: Not Currently   Drug use: Not Currently   Sexual activity: Yes    Birth control/protection: None  Other Topics Concern   Not on file  Social History Narrative   Not on file   Social Determinants of Health   Financial Resource Strain: Not on file  Food Insecurity: Not on file  Transportation Needs: Not on file  Physical Activity: Not on file  Stress: Not on file  Social Connections: Not on file     FAMILY HISTORY:  We obtained a detailed, 4-generation family history.  Significant diagnoses are listed below: Family History  Problem Relation Age of Onset   Diabetes Mother    Hypertension Father    Colon cancer Father 8   Cancer Father    Heart disease Father    Cervical cancer Sister    Diabetes Maternal  Aunt    Diabetes Maternal Uncle    Hypertension Paternal Uncle    Diabetes Maternal Grandmother    Stroke Maternal Grandmother    Diabetes Maternal Grandfather    Hypertension Maternal Grandfather    Hypertension Paternal Grandfather    Cancer Paternal Grandfather        lung   Heart disease Paternal Grandfather    Breast cancer Other    Breast cancer Cousin        dx 59s   Esophageal cancer Neg Hx    Rectal cancer Neg Hx    Kathryn Santos has 1 son and 5 daughters, no cancers. She has 1 full brother, 1 full sister, and 1 paternal half sister. Her full sister had cervical cancer in her 51s.   Kathryn Santos father had colon cancer at 76, is living at 4, and has not had genetic testing. Patient has 1 paternal aunt, 1 paternal uncle, no cancers. Paternal grandfather had lung cancer that was  metastatic and died in his 75s. He had a sister that also died from cancer, unknown type. Paternal grandmother is living at 75 and her sister her sister's 3 daughters all had breast cancer, some in their 89s.   Kathryn Santos mother is living at 14, no cancers. Patient has 2 maternal aunts, 2 maternal uncles, and her mother also has about 4 half siblings through her father. No cancers for them or for cousins. Maternal grandmother is living at 32. Grandfather died in his 28s, may have had cancer.  Kathryn Santos is unaware of previous family history of genetic testing for hereditary cancer risks. Patient's maternal ancestors are of unknown descent, and paternal ancestors are of unknown descent. There is no reported Ashkenazi Jewish ancestry. There is no known consanguinity.    GENETIC COUNSELING ASSESSMENT: Kathryn Santos is a 45 y.o. female with a family history of colon cancer which is somewhat suggestive of a hereditary cancer syndrome and predisposition to cancer. We, therefore, discussed and recommended the following at today's visit.   DISCUSSION: We discussed that approximately 5-10% of colon cancer is hereditary. Most cases of hereditary colon cancer are associated with Lynch syndrome genes, although there are other genes associated with hereditary  cancer as well. Cancers and risks are gene specific.  Duodenal adenomas can also be seen in some hereditary cancer conditions such as FAP and MAP. We discussed that testing is beneficial for several reasons including knowing about cancer risks, identifying potential screening and risk-reduction options that may be appropriate, and to understand if other family members could be at risk for cancer and allow them to undergo genetic testing.   We reviewed the characteristics, features and inheritance patterns of hereditary cancer syndromes. We also discussed genetic testing, including the appropriate family members to test, the process of testing, insurance coverage  and turn-around-time for results. We discussed the implications of a negative, positive and/or variant of uncertain significant result. We recommended Kathryn Santos pursue genetic testing for the [employer] CancerNext-Expanded+RNA gene panel.   The CancerNext-Expanded + RNAinsight gene panel offered by [employer] and includes sequencing and rearrangement analysis for the following 77 genes: IP, ALK, APC*, ATM*, AXIN2, BAP1, BARD1, BLM, BMPR1A, BRCA1*, BRCA2*, BRIP1*, CDC73, CDH1*,CDK4, CDKN1B, CDKN2A, CHEK2*, CTNNA1, DICER1, FANCC, FH, FLCN, GALNT12, KIF1B, LZTR1, MAX, MEN1, MET, MLH1*, MSH2*, MSH3, MSH6*, MUTYH*, NBN, NF1*, NF2, NTHL1, PALB2*, PHOX2B, PMS2*, POT1, PRKAR1A, PTCH1, PTEN*, RAD51C*, RAD51D*,RB1, RECQL, RET, SDHA, SDHAF2, SDHB, SDHC, SDHD, SMAD4, SMARCA4, SMARCB1, SMARCE1, STK11, SUFU, TMEM127, TP53*,TSC1, TSC2, VHL  and XRCC2 (sequencing and deletion/duplication); EGFR, EGLN1, HOXB13, KIT, MITF, PDGFRA, POLD1 and POLE (sequencing only); EPCAM and GREM1 (deletion/duplication only).  Based on Kathryn Santos's family history of cancer, she meets medical criteria for genetic testing. Despite that she meets criteria, she may still have an out of pocket cost. We discussed that if her out of pocket cost for testing is over $100, the laboratory will call and confirm whether she wants to proceed with testing.  If the out of pocket cost of testing is less than $100 she will be billed by the genetic testing laboratory.   PLAN: After considering the risks, benefits, and limitations, Kathryn Santos provided informed consent to pursue genetic testing and the blood sample was sent to [facility] for analysis of the CancerNext-Expanded+RNA panel. Results should be available within approximately 2-3 weeks' time, at which point they will be disclosed by telephone to Kathryn Santos, as will any additional recommendations warranted by these results. Kathryn Santos will receive a summary of her genetic counseling visit and a copy of  her results once available. This information will also be available in Epic.   Kathryn Santos questions were answered to her satisfaction today. Our contact information was provided should additional questions or concerns arise. Thank you for the referral and allowing [place] to share in the care of your patient.   Kathryn Rogue, MS, [facility] Genetic Counselor [email or web address] Phone: [phone]  The patient was seen for a total of 40 minutes in face-to-face genetic counseling.  Patient brought her husband, Kathryn Santos. Drs. Magrinat/Gudena/and or Burr Medico were available for discussion regarding this case.   _______________________________________________________________________ For Office Staff:  Number of people involved in session: 2 Was an Intern/ student involved with case: no

## 2020-08-19 ENCOUNTER — Telehealth: Payer: Self-pay

## 2020-08-19 ENCOUNTER — Telehealth: Payer: Self-pay | Admitting: Licensed Clinical Social Worker

## 2020-08-19 ENCOUNTER — Ambulatory Visit: Payer: Self-pay | Admitting: Licensed Clinical Social Worker

## 2020-08-19 ENCOUNTER — Encounter: Payer: Self-pay | Admitting: Licensed Clinical Social Worker

## 2020-08-19 DIAGNOSIS — Z801 Family history of malignant neoplasm of trachea, bronchus and lung: Secondary | ICD-10-CM

## 2020-08-19 DIAGNOSIS — Z800 Family history of malignant neoplasm of digestive organs: Secondary | ICD-10-CM

## 2020-08-19 DIAGNOSIS — Z8049 Family history of malignant neoplasm of other genital organs: Secondary | ICD-10-CM

## 2020-08-19 DIAGNOSIS — Z1379 Encounter for other screening for genetic and chromosomal anomalies: Secondary | ICD-10-CM

## 2020-08-19 DIAGNOSIS — Z803 Family history of malignant neoplasm of breast: Secondary | ICD-10-CM

## 2020-08-19 NOTE — Telephone Encounter (Signed)
Left message for pt to call back as insurance has not approved the capsule endo, procedure needs to be moved back a few days. Left message for pt to call back.  Pt came in for procedure on 08/22/20 but after further discussion pt did not do the prep. Pt rescheduled for capsule tomorrow 08/23/20 at 8:30am. Pt aware of appt. Pt also wanted to know what she can take for a headache. Discussed with pt that she can take Tylenol.

## 2020-08-19 NOTE — Progress Notes (Signed)
HPI:  [name] was previously seen in the [place] clinic due to a personal history of duodenal adenomas and family history of cancer and concerns regarding a hereditary predisposition to cancer. Please refer to our prior cancer genetics clinic note for more information regarding our discussion, assessment and recommendations, at the time. [name] recent genetic test results were disclosed to her, as were recommendations warranted by these results. These results and recommendations are discussed in more detail below.  CANCER HISTORY:  Oncology History   No history exists.    FAMILY HISTORY:  We obtained a detailed, 4-generation family history.  Significant diagnoses are listed below: Family History  Problem Relation Age of Onset   Diabetes Mother    Hypertension Father    Colon cancer Father 44   Cancer Father    Heart disease Father    Cervical cancer Sister    Diabetes Maternal Aunt    Diabetes Maternal Uncle    Hypertension Paternal Uncle    Diabetes Maternal Grandmother    Stroke Maternal Grandmother    Diabetes Maternal Grandfather    Hypertension Maternal Grandfather    Hypertension Paternal Grandfather    Cancer Paternal Grandfather        lung   Heart disease Paternal Grandfather    Breast cancer Other    Breast cancer Cousin        dx 70s   Esophageal cancer Neg Hx    Rectal cancer Neg Hx      [name] has 1 son and 5 daughters, no cancers. She has 1 full brother, 1 full sister, and 1 paternal half sister. Her full sister had cervical cancer in her 109s.   [name] father had colon cancer at 58, is living at 20, and has not had genetic testing. Patient has 1 paternal aunt, 1 paternal uncle, no cancers. Paternal grandfather had lung cancer that was metastatic and died in his 71s. He had a sister that also died from cancer, unknown type. Paternal grandmother is living at 41 and her sister her sister's 3 daughters all had breast cancer, some in  their 95s.   [name] mother is living at 40, no cancers. Patient has 2 maternal aunts, 2 maternal uncles, and her mother also has about 4 half siblings through her father. No cancers for them or for cousins. Maternal grandmother is living at 61. Grandfather died in his 56s, may have had cancer.   [name] is unaware of previous family history of genetic testing for hereditary cancer risks. Patient's maternal ancestors are of unknown descent, and paternal ancestors are of unknown descent. There is no reported Ashkenazi Jewish ancestry. There is no known consanguinity.     GENETIC TEST RESULTS: Genetic testing reported out on 7/7/2022through the [employer] CancerNext-Expanded+RNA cancer panel found no pathogenic mutations.   The CancerNext-Expanded + RNAinsight gene panel offered by [employer] and includes sequencing and rearrangement analysis for the following 77 genes: IP, ALK, APC*, ATM*, AXIN2, BAP1, BARD1, BLM, BMPR1A, BRCA1*, BRCA2*, BRIP1*, CDC73, CDH1*,CDK4, CDKN1B, CDKN2A, CHEK2*, CTNNA1, DICER1, FANCC, FH, FLCN, GALNT12, KIF1B, LZTR1, MAX, MEN1, MET, MLH1*, MSH2*, MSH3, MSH6*, MUTYH*, NBN, NF1*, NF2, NTHL1, PALB2*, PHOX2B, PMS2*, POT1, PRKAR1A, PTCH1, PTEN*, RAD51C*, RAD51D*,RB1, RECQL, RET, SDHA, SDHAF2, SDHB, SDHC, SDHD, SMAD4, SMARCA4, SMARCB1, SMARCE1, STK11, SUFU, TMEM127, TP53*,TSC1, TSC2, VHL and XRCC2 (sequencing and deletion/duplication); EGFR, EGLN1, HOXB13, KIT, MITF, PDGFRA, POLD1 and POLE (sequencing only); EPCAM and GREM1 (deletion/duplication only).  The test report has been scanned  into EPIC and is located under the Molecular Pathology section of the Results Review tab.  A portion of the result report is included below for reference.     We discussed that because current genetic testing is not perfect, it is possible there may be a gene mutation in one of these genes that current testing cannot detect, but that chance is small.  There could be another gene that has not yet  been discovered, or that we have not yet tested, that is responsible for the cancer diagnoses in the family. It is also possible there is a hereditary cause for the cancer in the family that [name] did not inherit and therefore was not identified in her testing.  Therefore, it is important to remain in touch with cancer genetics in the future so that we can continue to offer [name] the most up to date genetic testing.   ADDITIONAL GENETIC TESTING: We discussed with [name] that her genetic testing was fairly extensive.  If there are genes identified to increase cancer risk that can be analyzed in the future, we would be happy to discuss and coordinate this testing at that time.    CANCER SCREENING RECOMMENDATIONS: [name] test result is considered negative (normal).  This means that we have not identified a hereditary cause for her duodenal adenomas or family history of cancer at this time.   While reassuring, this does not definitively rule out a hereditary predisposition to cancer. It is still possible that there could be genetic mutations that are undetectable by current technology. There could be genetic mutations in genes that have not been tested or identified to increase cancer risk.  Therefore, it is recommended she continue to follow the cancer management and screening guidelines provided by her primary healthcare provider.   An individual's cancer risk and medical management are not determined by genetic test results alone. Overall cancer risk assessment incorporates additional factors, including personal medical history, family history, and any available genetic information that may result in a personalized plan for cancer prevention and surveillance.  RECOMMENDATIONS FOR FAMILY: Family members might be at some increased risk of developing cancer, over the general population risk, simply due to the family history of cancer.  We recommended female relatives in this family have a  yearly mammogram beginning at age 46, or 74 years younger than the earliest onset of cancer, an annual clinical breast exam, and perform monthly breast self-exams. Female relatives in this family should also have a gynecological exam as recommended by their primary provider.  All family members should be referred for colonoscopy starting at age 54.   It is also possible there is a hereditary cause for the cancer in [name]'s family that she did not inherit and therefore was not identified in her.  Based on [name]'s family history, we recommended her father/those related to him, as well as those related to her distant cousins with young breast cancer, have genetic counseling and testing. [name] will let us know if we can be of any assistance in coordinating genetic counseling and/or testing for these family members.  FOLLOW-UP: Lastly, we discussed with [name] that cancer genetics is a rapidly advancing field and it is possible that new genetic tests will be appropriate for her and/or her family members in the future. We encouraged her to remain in contact with cancer genetics on an annual basis so we can update her personal and family histories and let her know of  advances in cancer genetics that may benefit this family.   Our contact number was provided. [name] questions were answered to her satisfaction, and she knows she is welcome to call us at anytime with additional questions or concerns.   Faith Rogue, MS, [facility] Genetic Counselor [email or web address] Phone: [phone]

## 2020-08-19 NOTE — Telephone Encounter (Signed)
Revealed negative genetic testing.   We discussed that we do not know why she has had duodenal adenomas  or why there is cancer in the family. It could be due to a different gene that we are not testing, or something our current technology cannot pick up.  It will be important for her to keep in contact with genetics to learn if additional testing may be needed in the future.

## 2020-08-22 ENCOUNTER — Encounter: Payer: Managed Care, Other (non HMO) | Admitting: Gastroenterology

## 2020-08-22 ENCOUNTER — Ambulatory Visit: Payer: Managed Care, Other (non HMO) | Admitting: Gastroenterology

## 2020-08-22 NOTE — Progress Notes (Signed)
Patient arrived had not prepped for capsule night prior. Patient rescheduled for 08/23/20

## 2020-08-23 ENCOUNTER — Emergency Department (HOSPITAL_COMMUNITY)
Admission: EM | Admit: 2020-08-23 | Discharge: 2020-08-24 | Disposition: A | Discharge status: Home / Self Care | Payer: Managed Care, Other (non HMO) | Attending: Emergency Medicine | Admitting: Emergency Medicine

## 2020-08-23 ENCOUNTER — Telehealth: Payer: Self-pay | Admitting: Gastroenterology

## 2020-08-23 ENCOUNTER — Other Ambulatory Visit: Payer: Self-pay

## 2020-08-23 ENCOUNTER — Ambulatory Visit (INDEPENDENT_AMBULATORY_CARE_PROVIDER_SITE_OTHER): Payer: Managed Care, Other (non HMO) | Admitting: Gastroenterology

## 2020-08-23 ENCOUNTER — Encounter: Payer: Self-pay | Admitting: Gastroenterology

## 2020-08-23 DIAGNOSIS — R1032 Left lower quadrant pain: Secondary | ICD-10-CM | POA: Insufficient documentation

## 2020-08-23 DIAGNOSIS — E039 Hypothyroidism, unspecified: Secondary | ICD-10-CM | POA: Insufficient documentation

## 2020-08-23 DIAGNOSIS — R109 Unspecified abdominal pain: Secondary | ICD-10-CM | POA: Diagnosis present

## 2020-08-23 DIAGNOSIS — Z9049 Acquired absence of other specified parts of digestive tract: Secondary | ICD-10-CM | POA: Diagnosis not present

## 2020-08-23 DIAGNOSIS — D132 Benign neoplasm of duodenum: Secondary | ICD-10-CM

## 2020-08-23 LAB — COMPREHENSIVE METABOLIC PANEL
ALT: 23 U/L (ref 0–44)
AST: 25 U/L (ref 15–41)
Albumin: 3.7 g/dL (ref 3.5–5.0)
Alkaline Phosphatase: 84 U/L (ref 38–126)
Anion gap: 10 (ref 5–15)
BUN: 14 mg/dL (ref 6–20)
CO2: 24 mmol/L (ref 22–32)
Calcium: 9.2 mg/dL (ref 8.9–10.3)
Chloride: 104 mmol/L (ref 98–111)
Creatinine, Ser: 1.03 mg/dL — ABNORMAL HIGH (ref 0.44–1.00)
GFR, Estimated: >60 mL/min (ref >60)
Glucose, Bld: 94 mg/dL (ref 70–99)
Potassium: 4.1 mmol/L (ref 3.5–5.1)
Sodium: 138 mmol/L (ref 135–145)
Total Bilirubin: 0.3 mg/dL (ref 0.3–1.2)
Total Protein: 7.2 g/dL (ref 6.5–8.1)

## 2020-08-23 LAB — CBC WITH DIFFERENTIAL/PLATELET
Abs Immature Granulocytes: 0.02 10*3/uL (ref 0.00–0.07)
Basophils Absolute: 0 10*3/uL (ref 0.0–0.1)
Basophils Relative: 0 %
Eosinophils Absolute: 0 10*3/uL (ref 0.0–0.5)
Eosinophils Relative: 0 %
HCT: 42.6 % (ref 36.0–46.0)
Hemoglobin: 14.1 g/dL (ref 12.0–15.0)
Immature Granulocytes: 0 %
Lymphocytes Relative: 28 %
Lymphs Abs: 2.6 10*3/uL (ref 0.7–4.0)
MCH: 30.6 pg (ref 26.0–34.0)
MCHC: 33.1 g/dL (ref 30.0–36.0)
MCV: 92.4 fL (ref 80.0–100.0)
Monocytes Absolute: 0.6 10*3/uL (ref 0.1–1.0)
Monocytes Relative: 6 %
Neutro Abs: 5.8 10*3/uL (ref 1.7–7.7)
Neutrophils Relative %: 66 %
Platelets: 290 10*3/uL (ref 150–400)
RBC: 4.61 MIL/uL (ref 3.87–5.11)
RDW: 11.9 % (ref 11.5–15.5)
WBC: 9 10*3/uL (ref 4.0–10.5)
nRBC: 0 % (ref 0.0–0.2)

## 2020-08-23 LAB — LIPASE, BLOOD: Lipase: 33 U/L (ref 11–51)

## 2020-08-23 MED ORDER — HYDROCODONE-ACETAMINOPHEN 5-325 MG PO TABS
2.0000 | ORAL_TABLET | Freq: Once | ORAL | Status: AC
Start: 2020-08-23 — End: 2020-08-24
  Administered 2020-08-24: 2 via ORAL
  Filled 2020-08-23: qty 2

## 2020-08-23 MED ORDER — ONDANSETRON 4 MG PO TBDP
4.0000 mg | ORAL_TABLET | Freq: Once | ORAL | Status: AC
  Administered 2020-08-24: 4 mg via ORAL
  Filled 2020-08-23: qty 1

## 2020-08-23 NOTE — Telephone Encounter (Signed)
She called this evening with worsening left sided abd pains which have been waxing, waning throughout the day, started last night before she swallowed pill camera this morning.  Pains are now severe. She was in tears on the phone, describes the pain like something is tearing inside her.    I recommended she go directly to the ER for evaluation.  No clear that this has anything to do with the SB pill camera since the pain started before she swallowed the camera.

## 2020-08-23 NOTE — ED Triage Notes (Signed)
Pt c/o abdominal pain yesterday after bowel prep. Took a capsule endoscopy today for procedure. Pain to LLQ ongoing and worsening since. Took gas x and drank water per recommendation without improvement. Feels like belly is ripping internally.

## 2020-08-23 NOTE — Progress Notes (Signed)
SN: [id number] Exp: 2021-12-20 LOT: [id number]  Patient arrived for VCE. Reported the prep went well. Per pt she developed lower left side abd pain several hours after starting prep.  She had several bowel movements that relieved pain.  However, she woke up in the middle of the night with pain in the same area after several hours of not having a BM or passing gas.  No other symptoms.  Pain waxes and wanes and is relieved by nothing.  I consulted with Ellouise Newer PA who was seeing patients in the area my patient was ingesting the capsule.  She advised that this is probably gas and with no other symptoms, re; nausea or fever she is safe to ingest capsule. I advised the pt to call our office if the pain worsens or she develops other symptoms including severe abd pain, nausea, or fever.  She was also advised she should go to the ED for eval.  Pt agreed.  She verbalized understanding of the recommendations and agreed to proceed with capsule ingestion.  This RN explained capsule retrieval process and dietary restrictions for the next few hours. Patient verbalized understanding. Opened capsule, ensured capsule was flashing prior to the patient swallowing the capsule. Patient swallowed capsule without difficulty. Patient told to call the office with any questions and if no capsule passed after 72 hours. No further questions by the conclusion of the visit.

## 2020-08-23 NOTE — Patient Instructions (Signed)
You may have clear liquids beginning at 10:30 am after ingesting the capsule.    You can have a light lunch at 12:30 pm; sandwich and half bowl of soup.  Return to the office at 4 pm to return the equipment.   Return to you normal diet at 5 pm.   Call [phone] and ask for Aalina Brege BSN RN if you have any questions.  You should pass the capsule in your stool 8-48 hours after ingestion. If you have not passed the capsule, after 72 hours, please contact the office at [phone].    

## 2020-08-23 NOTE — ED Provider Notes (Signed)
Emergency Medicine Provider Triage Evaluation Note  Kathryn Santos , a 45 y.o. female  was evaluated in triage.  Pt complains of abdominal pain that started last night after beginning bowel prep for pill endoscopy.  Reports tearing sensation in lower left abdomen.  Reports associated nausea and gas.  Has been having BMs.  Denies fever.  Called GI and was told to come to ED.  Review of Systems  Positive: Abdominal pain, nausea Negative: Fever, dysuria  Physical Exam  BP (!) 142/98 (BP Location: Left Arm)   Pulse 86   Temp 98.6 F (37 C) (Oral)   Resp (!) 22   Ht 5\' 2"  (1.575 m)   Wt 83.9 kg   SpO2 99%   BMI 33.84 kg/m  Gen:   Awake, no distress   Resp:  Normal effort  MSK:   Moves extremities without difficulty  Other:  Appears uncomfortable.  TTP in LLQ  Medical Decision Making  Medically screening exam initiated at 10:30 PM.  Appropriate orders placed.  [name] was informed that the remainder of the evaluation will be completed by another provider, this initial triage assessment does not replace that evaluation, and the importance of remaining in the ED until their evaluation is complete.  Abdominal pain   Montine Circle, PA-C 08/23/20 [place], [place], DO 08/23/20 2319

## 2020-08-24 ENCOUNTER — Emergency Department (HOSPITAL_COMMUNITY): Payer: Managed Care, Other (non HMO)

## 2020-08-24 LAB — URINALYSIS, ROUTINE W REFLEX MICROSCOPIC
Bilirubin Urine: NEGATIVE
Glucose, UA: NEGATIVE mg/dL
Hgb urine dipstick: NEGATIVE
Ketones, ur: NEGATIVE mg/dL
Leukocytes,Ua: NEGATIVE
Nitrite: NEGATIVE
Protein, ur: NEGATIVE mg/dL
Specific Gravity, Urine: 1.023 (ref 1.005–1.030)
pH: 5 (ref 5.0–8.0)

## 2020-08-24 LAB — POC URINE PREG, ED: Preg Test, Ur: NEGATIVE

## 2020-08-24 MED ORDER — IOHEXOL 300 MG/ML  SOLN
100.0000 mL | Freq: Once | INTRAMUSCULAR | Status: AC | PRN
  Administered 2020-08-24: 100 mL via INTRAVENOUS

## 2020-08-24 NOTE — Telephone Encounter (Signed)
I am just seeing this message now after hours. Unfortunately I think really needs to be seen in person and examined to help sort this out. It appears since you sent that message she has an appointment booked with Dr. Tarri Glenn on Friday.  I recommend she take her Bentyl 3 times daily until that visit and see if that helps in the interim. Thanks

## 2020-08-24 NOTE — Telephone Encounter (Signed)
Reviewed her CT scan which shows that the capsule in the cecum, passed through the small intestine there is no evidence of obstruction.  There is no concerning pathology on CT scan to cause her pain.  Her labs were normal last night.  Unclear what is causing this, unsure if she has musculoskeletal pain that could be related to this?  We can try her empirically on some Bentyl 10 to 20 mg every 8 hours as needed to see if that helps if she has not tried that yet.  Do we have any openings with APP's or other providers who can see her acutely and examine her otherwise?  That would be best if possible thanks

## 2020-08-24 NOTE — Telephone Encounter (Signed)
Spoke with pt and she states she is not having cramping discomfort, this pain is different. Pt states she has dicyclomine that she can  take and she usually takes one every morning. There are no open appts with an APP or MD this week. Please advise.

## 2020-08-24 NOTE — Telephone Encounter (Signed)
[place] pt calling, she prepped for capsule endo Monday night and swallowed capsule Tuesday am. Reports when she came to swallow the capsule she had some pain in her LLQ. Ellouise Newer PA stated the discomfort was most likely related to the prep and ok to proceed with capsule endo. When pt returned to have monitor removed Tuesday pm she still c/o LLQ pain that comes and goes. She was instructed to take some gas-x for gas pain, if got worse call on-call md. Pt spoke with Dr. Ardis Hughs and was told to go to the ER. Pt had CT scan and was given some pain pills and sent home, no cause for discomfort found.  Pt calling now stating the discomfort is getting worse. Has the discomfort when sitting but when she tries to stand up she reports the pain in her LLQ feels as though something is tearing or ripping. Pt calling for recommendations. As DOD please advise.

## 2020-08-24 NOTE — Telephone Encounter (Signed)
Inbound call from patient following up. States she she was discharged from the hospital the pain is starting again to the point she is unable to walk. Have questions about why she is hurting and if something could help. Best contact number [phone]

## 2020-08-24 NOTE — ED Provider Notes (Signed)
[facility] EMERGENCY DEPARTMENT Provider Note   CSN: [id number] Arrival date & time: 08/23/20  2006     History Chief Complaint  Patient presents with   Abdominal Pain    Kathryn Santos is a 45 y.o. female.   Abdominal Pain Associated symptoms: no chest pain and no shortness of breath   Patient presents with abdominal pain.  Given bowel prep for capsule endoscopy.  Before the endoscopy started having crampy abdominal pain.  Severe on the left side.  Began to feel as if something was tearing.  She had talked with GI and had been given Gas-X and antispasmodics.  Continued pain.  Feeling somewhat better now after around a 9-1/2-hour wait to see me.  Has not had nausea or vomiting.  Has not had a bowel movement since yesterday.  Pain began 2 days ago.  No fevers.  No vaginal bleeding or discharge.    Past Medical History:  Diagnosis Date   Family history of breast cancer    Family history of cervical cancer    Family history of colon cancer    Family history of lung cancer    Thyroid disease    TIA (transient ischemic attack) 2013   Vaginal Pap smear, abnormal     Patient Active Problem List   Diagnosis Date Noted   Genetic testing 08/19/2020   Family history of colon cancer 08/08/2020   Family history of lung cancer 08/08/2020   Family history of breast cancer 08/08/2020   Family history of cervical cancer 08/08/2020   PTSD (post-traumatic stress disorder) 07/08/2020   Generalized anxiety disorder 07/08/2020   Infertility, female 02/02/2020   Cervical os stenosis 07/02/2017   Class 1 obesity due to excess calories without serious comorbidity with body mass index (BMI) of 33.0 to 33.9 in adult 06/26/2017   History of cholecystectomy 06/26/2017   Postoperative hypothyroidism 06/26/2017    Past Surgical History:  Procedure Laterality Date   CHOLECYSTECTOMY     ENDOMETRIAL ABLATION  2015   FLUOROSCOPIC TUBAL RECANNULATUON     GALLBLADDER SURGERY      THYROIDECTOMY  02/2007   TUBAL LIGATION       OB History     Gravida  9   Para  6   Term  4   Preterm  2   AB  3   Living  6      SAB  3   IAB      Ectopic      Multiple      Live Births  6           Family History  Problem Relation Age of Onset   Diabetes Mother    Hypertension Father    Colon cancer Father 66   Cancer Father    Heart disease Father    Cervical cancer Sister    Diabetes Maternal Aunt    Diabetes Maternal Uncle    Hypertension Paternal Uncle    Diabetes Maternal Grandmother    Stroke Maternal Grandmother    Diabetes Maternal Grandfather    Hypertension Maternal Grandfather    Hypertension Paternal Grandfather    Cancer Paternal Grandfather        lung   Heart disease Paternal Grandfather    Breast cancer Other    Breast cancer Cousin        dx 2s   Esophageal cancer Neg Hx    Rectal cancer Neg Hx     Social History  Tobacco Use   Smoking status: Never   Smokeless tobacco: Never  Vaping Use   Vaping Use: Never used  Substance Use Topics   Alcohol use: Not Currently   Drug use: Not Currently    Home Medications Prior to Admission medications   Medication Sig Start Date End Date Taking? Authorizing Provider  Cyanocobalamin (VITAMIN B-12) 6000 MCG SUBL Place under the tongue.    [provider]  gabapentin (NEURONTIN) 100 MG capsule Take 1 capsule (100 mg total) by mouth 3 (three) times daily as needed (high anxiety). 07/08/20   Patrecia Pour, NP  hydrOXYzine (ATARAX/VISTARIL) 10 MG tablet Take 1 tablet (10 mg total) by mouth 3 (three) times daily as needed for anxiety. 07/08/20   Patrecia Pour, NP  pantoprazole (PROTONIX) 40 MG tablet TAKE 1 TABLET BY MOUTH EVERY DAY 07/04/20   Thornton Park, MD  prenatal vitamin w/FE, FA (PRENATAL 1 + 1) 27-1 MG TABS tablet Take 1 tablet by mouth daily at 12 noon.    [provider]  pyridoxine (B-6) 100 MG tablet Take 100 mg by mouth daily.    [provider]    Allergies    Dilaudid [hydromorphone], Zithromax [azithromycin], and Mushroom extract complex  Review of Systems   Review of Systems  Constitutional:  Negative for appetite change.  HENT:  Negative for congestion.   Respiratory:  Negative for shortness of breath.   Cardiovascular:  Negative for chest pain.  Gastrointestinal:  Positive for abdominal pain. Negative for blood in stool.  Genitourinary:  Negative for flank pain.  Musculoskeletal:  Negative for back pain.  Skin:  Negative for rash.  Psychiatric/Behavioral:  Negative for confusion.    Physical Exam Updated Vital Signs BP (!) 144/98   Pulse 69   Temp 98 F (36.7 C)   Resp 12   Ht 5\' 2"  (1.575 m)   Wt 83.9 kg   SpO2 98%   BMI 33.84 kg/m   Physical Exam Vitals and nursing note reviewed.  Cardiovascular:     Rate and Rhythm: Normal rate and regular rhythm.  Pulmonary:     Breath sounds: No wheezing or rhonchi.  Abdominal:     Tenderness: There is abdominal tenderness.     Comments: Mild right lower quadrant tenderness without rebound or guarding.  No hernias palpated.  Skin:    General: Skin is warm.     Capillary Refill: Capillary refill takes less than 2 seconds.  Neurological:     Mental Status: She is alert.    ED Results / Procedures / Treatments   Labs (all labs ordered are listed, but only abnormal results are displayed) Labs Reviewed  COMPREHENSIVE METABOLIC PANEL - Abnormal; Notable for the following components:      Result Value   Creatinine, Ser 1.03 (*)    All other components within normal limits  URINALYSIS, ROUTINE W REFLEX MICROSCOPIC - Abnormal; Notable for the following components:   APPearance HAZY (*)    All other components within normal limits  CBC WITH DIFFERENTIAL/PLATELET  LIPASE, BLOOD  POC URINE PREG, ED    EKG None  Radiology CT ABDOMEN PELVIS W CONTRAST  Result Date: 08/24/2020 CLINICAL DATA:  Abdominal pain, nonlocalized EXAM: CT ABDOMEN AND  PELVIS WITH CONTRAST TECHNIQUE: Multidetector CT imaging of the abdomen and pelvis was performed using the standard protocol following bolus administration of intravenous contrast. CONTRAST:  124mL OMNIPAQUE IOHEXOL 300 MG/ML  SOLN COMPARISON:  None. FINDINGS: Lower chest: Scattered calcified granulomata  in the lung bases. Lung bases are otherwise clear. Normal heart size. No pericardial effusion. Hepatobiliary: No worrisome focal liver lesions. Smooth liver surface contour. Normal hepatic attenuation. Few punctate calcifications the liver may reflect sequela of prior granulomatous disease. Prior cholecystectomy. No significant biliary ductal dilatation or intraductal gallstones. Pancreas: No pancreatic ductal dilatation or surrounding inflammatory changes. Spleen: Multiple punctate calcifications in the spleen likely reflecting calcified granulomata. Normal in size. No concerning splenic lesions. Adrenals/Urinary Tract: Normal adrenals. Kidneys are normally located with symmetric enhancement and excretion. No suspicious renal lesion, urolithiasis or hydronephrosis. Urinary bladder is largely decompressed at the time of exam and therefore poorly evaluated by CT imaging. Mild bladder wall thickening is greater than expected for underdistention. Stomach/Bowel: Distal esophagus, stomach and duodenal sweep are unremarkable. No small bowel wall thickening or dilatation. Metallic density seen at the level of the cecum with associated streak artifact, could reflect ingested material such as iron supplementation or possible foreign body. No colonic dilatation or wall thickening. No evidence of resulting obstruction. Vascular/Lymphatic: No significant vascular findings are present. No enlarged abdominal or pelvic lymph nodes. Reproductive: Anteverted uterus with an enhancing 1 cm subserosal fibroid along the anterior uterine fundus. Small amount of fluid and gas within the vaginal canal, nonspecific though could correlate  with symptoms. No concerning adnexal mass or lesion. Other: No abdominopelvic free fluid or free gas. No bowel containing hernias. Postsurgical changes the anterior abdominal wall Musculoskeletal: No acute osseous abnormality or suspicious osseous lesion. IMPRESSION: 1. Nonspecific metallic radiodensity at the level of the cecum, correlate for possible ingested material or foreign body. No resulting obstruction or other bowel complication. 2. Subserosal fibroid at the level of the anterior uterine fundus. 3. Small amount of fluid and gas within the vaginal canal, correlate with patient's symptoms. 4. No other acute or significant abnormality in the abdomen or pelvis. 5. Evidence of prior granulomatous disease in the lung bases and abdomen. 6. Prior cholecystectomy. Electronically Signed   By: Lovena Le M.D.   On: 08/24/2020 02:51    Procedures Procedures   Medications Ordered in ED Medications  HYDROcodone-acetaminophen (NORCO/VICODIN) 5-325 MG per tablet 2 tablet (2 tablets Oral Given 08/24/20 0129)  ondansetron (ZOFRAN-ODT) disintegrating tablet 4 mg (4 mg Oral Given 08/24/20 0129)  iohexol (OMNIPAQUE) 300 MG/ML solution 100 mL (100 mLs Intravenous Contrast Given 08/24/20 0237)    ED Course  I have reviewed the triage vital signs and the nursing notes.  Pertinent labs & imaging results that were available during my care of the patient were reviewed by me and considered in my medical decision making (see chart for details).    MDM Rules/Calculators/A&P                          Patient abdominal pain post bowel prep.  Crampy left side.  Also tearing.  Feeling somewhat better.  CT scan done and reassuring.  No clear cause.  No obstruction.  Still has capsule in her colon.  Labs reassuring also.  I feel patient does not need admission to the hospital at this time.  Can call GI about further potential medications to help with this.  Already is on antispasmodics and anti-gas medicines.  Discharge  home. Final Clinical Impression(s) / ED Diagnoses Final diagnoses:  Left lower quadrant abdominal pain    Rx / DC Orders ED Discharge Orders     None        Davonna Belling, MD 08/24/20 [phone]

## 2020-08-25 ENCOUNTER — Telehealth: Payer: Self-pay | Admitting: Gastroenterology

## 2020-08-25 NOTE — Telephone Encounter (Signed)
Spoke with pt and let her know Dr. Doyne Keel recommendations regarding the bentyl. Pt states she is taking the bentyl and knows to keep her OV as scheduled.

## 2020-08-25 NOTE — Telephone Encounter (Signed)
Capsule endoscopy reviewed: 08/23/20 Complete exam of the small intestine Adequate prep No small bowel lesions / adenomas appreciated.  Report will be scanned into Epic.  Bertram Millard, I think this patient is scheduled to see you 7/15.

## 2020-08-26 ENCOUNTER — Encounter: Payer: Self-pay | Admitting: Gastroenterology

## 2020-08-26 ENCOUNTER — Ambulatory Visit: Payer: Managed Care, Other (non HMO) | Admitting: Gastroenterology

## 2020-08-26 VITALS — BP 134/94 | HR 88 | Ht 62.0 in | Wt 188.5 lb

## 2020-08-26 DIAGNOSIS — R110 Nausea: Secondary | ICD-10-CM | POA: Diagnosis not present

## 2020-08-26 DIAGNOSIS — R1032 Left lower quadrant pain: Secondary | ICD-10-CM

## 2020-08-26 DIAGNOSIS — R140 Abdominal distension (gaseous): Secondary | ICD-10-CM | POA: Diagnosis not present

## 2020-08-26 DIAGNOSIS — D132 Benign neoplasm of duodenum: Secondary | ICD-10-CM

## 2020-08-26 NOTE — Patient Instructions (Addendum)
It was my pleasure to provide care to you today. Based on our discussion, I am providing you with my recommendations below:  RECOMMENDATION(S):   No further recommendations are given today  Please keep your appointment with your OB/Gyn  FOLLOW UP:  I would like for you to follow up with me as needed. Please call the office at [phone] to schedule your appointment as you feel needed.  BMI:  If you are age 45 or younger, your body mass index should be between 19-25. Your There is no height or weight on file to calculate BMI. If this is out of the aformentioned range listed, please consider follow up with your Primary Care Provider.   MY CHART:  The [place] [facility] providers would like to encourage you to use [facility] to communicate with providers for non-urgent requests or questions.  Due to long hold times on the telephone, sending your provider a message by [facility] may be a faster and more efficient way to get a response.  Please allow 48 business hours for a response.  Please remember that this is for non-urgent requests.   Thank you for trusting me with your gastrointestinal care!    Thornton Park, MD, MPH

## 2020-08-26 NOTE — Progress Notes (Signed)
Referring Provider: Juline Patch, MD Primary Care Physician:  Juline Patch, MD  Chief complaint:  Abdominal pain   IMPRESSION:  Acute onset of left pelvic/groin pain  History of intermittent RLQ pain 1cm subserosal fibroid, small amount of fluid and gas within the vaginal canal on recent CT Duodenal adenoma x 2 Normal colon biopsies 3/22 Near chronic nausea, bloating, and altered bowel habits  Cholecystectomy for symptomatic gallstones 2001 Family history of colon cancer (father at age 26)  Acute onset LLQ/groin pain not explained by contrasted CT or capsule endoscopy. Symptoms developed acutely 4 days ago without any significant relief. Similar to symptoms several years ago that were relieved with cervical dilation. Dr. Kennon Rounds is out of the office on vacation. I spoke with Dr. Charlett Lango office who offered to see her today. If GYN evaluation is negative, I encouraged Mrs. Gudiel to follow-up with her PCP to consider radicular and other no-GI causes of her pain.   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 No further lesions seen on capsule endoscopy. There is also an increased association of colon cancer in the setting of duodenal adenomas. Given that risk, I recommend colonoscopy at 5 year intervals.    Family history of colon cancer: Surveillance colonoscopy due in 5 years. Awaiting results from genetic testing.  Recent GI symptoms including nausea and bloating: Not active issues today.      PLAN: Continue Metamucil or Benefiber Continue dicyclomine 10 mg QID PRN She will go directly from our office to GYN appointment in [facility] for an 11am appointment Follow-up with PCP re: radicular causes of groin pain if GYN evaluation is negative  Please see the "Patient Instructions" section for addition details about the plan.  HPI: Kathryn Santos is a 45 y.o. female originally referred by Dr. Kennon Rounds for a family history  of colon cancer- her father had colon cancer at age 65.  She has a history of hypothyroidism, nephrolithiasis, TIA, and cholecystectomy for symptomatic gallstones in 2001.  She had a tubular ligation in 2010 with reversal in 2021, endometrial ablation in 2018 for menorrhagia.  He husband accompanies her to this appointment today.   At the time of her initial consultation she reported near constant nausea with associated bloating for at least 3 years. Awakes with symptoms, any then they occur intermittently throughout the day. Removed gluten with some improvement. Has tried an autoimmune diet. Has resulted in eating more fruits and vegetables. With this change she notes alternating bowel habits from stools like pebbles, to normal shape and form, while some days she has severe postprandial diarrhea with associated urgency. She has had several accidents. Reminds her of the symptoms that she intially experienced after her cholecystectomy. No blood or mucous in the stool. Has limited dairy in the event that lactose intolerance is contributing. Red meat results in constipation. Reduced caffeine intake, as well. Trying to get pregnant but the nausea and bloating has complicated her efforts.   Review of Epic shows an office visit with Dr. Ilda Basset, her OB/GYN for RLQ abdominal pain 04/19/20. His consult note and ultrasound results were reviewed. His exam was negative except for scant old blood in the vault.  Endoscopic evaluation of symptoms and for her family history of colon cancer occurred earlier this year.  Two duodenal adenomas were removed.  Random colon biopsies were normal.  Repeat EGD recommended in 6 months.  Capsule endoscopy recommended to evaluate for additional small bowel lesions and adenomatous.  Given the small bowel adenomas and her family history she was also referred to genetic counseling.  I also recommended a short surveillance colonoscopy interval with repeat exam in 2027.  She called earlier  this week with escalating pain while prepping for the capsule endoscopy.  Developed left-sided abdominal/groin pain described as a tearing sensation not improved by Gas-X, scheduled Tylenol or antispasmodics. Pain most severe when walking.  Evaluation in the ED included a CT of the abdomen and pelvis with contrast 08/24/2020 that showed the capsule in the cecum but no bowel obstruction or bowel complication, subserosal fibroid, small amount of fluid and gas within the vaginal canal, prior granulomatous disease in the lungs and abdomen, and prior cholecystectomy. I personally reviewed the CT scan images. Labs showed normal CMP except for creatinine of 1.03, normal lipase, and normal CBC. UA was normal.  Capsule endoscopy was negative with adequate prep.  Worked in today with ongoing LLQ pain that awoke her from sleep when it started 4 days ago. Pain is a constant groin/pelvic pain that intensifies with walking or urinating with a sensation that something is ripping. There may be some involvement of the anterior thigh. She has been unable to sleep since Tuesday because of the pain. Pain worse in the car especially going over railroad tracks. No change in bowel habits, although she has only had a small BM since completing the capsule endoscopy. No injury.   No period since her procedure with Dr. Kerin Perna' 07/22/20 even though she took medications last week to restart her menstrual cycle.    Distant history of GYN pain in the past that may have been similar. At that time it also awoke her from her sleep and resulted in ED visit. Improved with cervical dilation. Most recently receiving care with Dr. Kennon Rounds.    Past Medical History:  Diagnosis Date   Family history of breast cancer    Family history of cervical cancer    Family history of colon cancer    Family history of lung cancer    Thyroid disease    TIA (transient ischemic attack) 2013   Vaginal Pap smear, abnormal     Past Surgical History:   Procedure Laterality Date   CHOLECYSTECTOMY     ENDOMETRIAL ABLATION  2015   FLUOROSCOPIC TUBAL RECANNULATUON     GALLBLADDER SURGERY     THYROIDECTOMY  02/2007   TUBAL LIGATION      Current Outpatient Medications  Medication Sig Dispense Refill   Cyanocobalamin (VITAMIN B-12) 6000 MCG SUBL Place under the tongue.     dicyclomine (BENTYL) 10 MG capsule Take 10 mg by mouth every 4 (four) hours.     gabapentin (NEURONTIN) 100 MG capsule Take 1 capsule (100 mg total) by mouth 3 (three) times daily as needed (high anxiety). 90 capsule 2   hydrOXYzine (ATARAX/VISTARIL) 10 MG tablet Take 1 tablet (10 mg total) by mouth 3 (three) times daily as needed for anxiety. 180 tablet 2   pantoprazole (PROTONIX) 40 MG tablet TAKE 1 TABLET BY MOUTH EVERY DAY 90 tablet 0   prenatal vitamin w/FE, FA (PRENATAL 1 + 1) 27-1 MG TABS tablet Take 1 tablet by mouth daily at 12 noon.     pyridoxine (B-6) 100 MG tablet Take 100 mg by mouth daily.     Current Facility-Administered Medications  Medication Dose Route Frequency Provider Last Rate Last Admin   0.9 %  sodium chloride infusion  500 mL Intravenous Once Thornton Park, MD  Allergies as of 08/26/2020 - Review Complete 08/26/2020  Allergen Reaction Noted   Dilaudid [hydromorphone] Itching and Nausea And Vomiting 02/01/2020   Zithromax [azithromycin] Hives and Other (See Comments) 02/01/2020   Mushroom extract complex Hives 06/18/2017    Family History  Problem Relation Age of Onset   Diabetes Mother    Hypertension Father    Colon cancer Father 64   Cancer Father    Heart disease Father    Cervical cancer Sister    Diabetes Maternal Aunt    Diabetes Maternal Uncle    Hypertension Paternal Uncle    Diabetes Maternal Grandmother    Stroke Maternal Grandmother    Diabetes Maternal Grandfather    Hypertension Maternal Grandfather    Hypertension Paternal Grandfather    Cancer Paternal Grandfather        lung   Heart disease Paternal  Grandfather    Breast cancer Other    Breast cancer Cousin        dx 30s   Esophageal cancer Neg Hx    Rectal cancer Neg Hx      Physical Exam: General:   Alert,  well-nourished, pleasant and cooperative in NAD Head:  Normocephalic and atraumatic. Eyes:  Sclera clear, no icterus.   Conjunctiva pink. Ears:  Normal auditory acuity. Nose:  No deformity, discharge,  or lesions. Mouth:  No deformity or lesions.   Neck:  Supple; no masses or thyromegaly. Lungs:  Clear throughout to auscultation.   No wheezes. Heart:  Regular rate and rhythm; no murmurs. Abdomen:  Soft, tender in the left groin but the pain does not cross the midline, nondistended, normal bowel sounds, no rebound or guarding. No hepatosplenomegaly.  Negative Carnett's sign. Skin:  Intact without significant lesions or rashes. Psych:  Alert and cooperative. Normal mood and affect.   Aza Dantes L. Tarri Glenn, MD, MPH 08/26/2020, 12:34 PM

## 2020-08-29 ENCOUNTER — Emergency Department
Admission: EM | Admit: 2020-08-29 | Discharge: 2020-08-29 | Disposition: A | Discharge status: Home / Self Care | Payer: Managed Care, Other (non HMO) | Attending: Emergency Medicine | Admitting: Emergency Medicine

## 2020-08-29 ENCOUNTER — Encounter: Payer: Self-pay | Admitting: Emergency Medicine

## 2020-08-29 ENCOUNTER — Emergency Department: Payer: Managed Care, Other (non HMO)

## 2020-08-29 ENCOUNTER — Encounter: Payer: Self-pay | Admitting: Family Medicine

## 2020-08-29 ENCOUNTER — Telehealth: Payer: Self-pay

## 2020-08-29 ENCOUNTER — Other Ambulatory Visit: Payer: Self-pay

## 2020-08-29 ENCOUNTER — Other Ambulatory Visit (HOSPITAL_COMMUNITY): Payer: Self-pay | Admitting: Psychiatry

## 2020-08-29 ENCOUNTER — Ambulatory Visit: Payer: Managed Care, Other (non HMO) | Admitting: Family Medicine

## 2020-08-29 VITALS — BP 130/98 | HR 90 | Ht 62.0 in | Wt 187.0 lb

## 2020-08-29 DIAGNOSIS — E039 Hypothyroidism, unspecified: Secondary | ICD-10-CM | POA: Diagnosis not present

## 2020-08-29 DIAGNOSIS — R1032 Left lower quadrant pain: Secondary | ICD-10-CM | POA: Insufficient documentation

## 2020-08-29 DIAGNOSIS — Z79899 Other long term (current) drug therapy: Secondary | ICD-10-CM | POA: Diagnosis not present

## 2020-08-29 LAB — COMPREHENSIVE METABOLIC PANEL
ALT: 21 U/L (ref 0–44)
AST: 26 U/L (ref 15–41)
Albumin: 4.2 g/dL (ref 3.5–5.0)
Alkaline Phosphatase: 88 U/L (ref 38–126)
Anion gap: 10 (ref 5–15)
BUN: 15 mg/dL (ref 6–20)
CO2: 22 mmol/L (ref 22–32)
Calcium: 9.4 mg/dL (ref 8.9–10.3)
Chloride: 105 mmol/L (ref 98–111)
Creatinine, Ser: 0.9 mg/dL (ref 0.44–1.00)
GFR, Estimated: >60 mL/min (ref >60)
Glucose, Bld: 88 mg/dL (ref 70–99)
Potassium: 3.7 mmol/L (ref 3.5–5.1)
Sodium: 137 mmol/L (ref 135–145)
Total Bilirubin: 0.6 mg/dL (ref 0.3–1.2)
Total Protein: 7.9 g/dL (ref 6.5–8.1)

## 2020-08-29 LAB — URINALYSIS, COMPLETE (UACMP) WITH MICROSCOPIC
Bilirubin Urine: NEGATIVE
Glucose, UA: NEGATIVE mg/dL
Hgb urine dipstick: NEGATIVE
Ketones, ur: NEGATIVE mg/dL
Nitrite: NEGATIVE
Protein, ur: 30 mg/dL — AB
Specific Gravity, Urine: 1.016 (ref 1.005–1.030)
pH: 5 (ref 5.0–8.0)

## 2020-08-29 LAB — LIPASE, BLOOD: Lipase: 29 U/L (ref 11–51)

## 2020-08-29 LAB — CBC
HCT: 43.1 % (ref 36.0–46.0)
Hemoglobin: 14.5 g/dL (ref 12.0–15.0)
MCH: 29.9 pg (ref 26.0–34.0)
MCHC: 33.6 g/dL (ref 30.0–36.0)
MCV: 88.9 fL (ref 80.0–100.0)
Platelets: 335 10*3/uL (ref 150–400)
RBC: 4.85 MIL/uL (ref 3.87–5.11)
RDW: 11.5 % (ref 11.5–15.5)
WBC: 5.9 10*3/uL (ref 4.0–10.5)
nRBC: 0 % (ref 0.0–0.2)

## 2020-08-29 LAB — POC URINE PREG, ED: Preg Test, Ur: NEGATIVE

## 2020-08-29 MED ORDER — ONDANSETRON HCL 4 MG/2ML IJ SOLN
4.0000 mg | Freq: Once | INTRAMUSCULAR | Status: AC
  Administered 2020-08-29: 4 mg via INTRAVENOUS
  Filled 2020-08-29: qty 2

## 2020-08-29 MED ORDER — HYDROCODONE-ACETAMINOPHEN 5-325 MG PO TABS: 2.0000 | ORAL_TABLET | Freq: Four times a day (QID) | ORAL | 0 refills | Status: AC | PRN

## 2020-08-29 MED ORDER — FENTANYL CITRATE (PF) 100 MCG/2ML IJ SOLN
100.0000 ug | Freq: Once | INTRAMUSCULAR | Status: AC
  Administered 2020-08-29: 100 ug via INTRAVENOUS
  Filled 2020-08-29: qty 2

## 2020-08-29 MED ORDER — ACETAMINOPHEN 500 MG PO TABS
1000.0000 mg | ORAL_TABLET | Freq: Once | ORAL | Status: AC
  Administered 2020-08-29: 1000 mg via ORAL
  Filled 2020-08-29: qty 2

## 2020-08-29 MED ORDER — CEPHALEXIN 500 MG PO CAPS: 500.0000 mg | ORAL_CAPSULE | Freq: Four times a day (QID) | ORAL | 0 refills | Status: AC

## 2020-08-29 MED ORDER — IOHEXOL 350 MG/ML SOLN
100.0000 mL | Freq: Once | INTRAVENOUS | Status: AC | PRN
  Administered 2020-08-29: 100 mL via INTRAVENOUS
  Filled 2020-08-29: qty 100

## 2020-08-29 NOTE — ED Triage Notes (Signed)
C/O LLQ abdominal pain x1 week.  Had endoscopy last week, states pain started after taking the prep for endoscopy.  Seen through ED at [facility] for same pain.  CT abd done.  Seen by GI on Friday, patient was referred to [facility].  Seen by GYN on Friday, US done and patient then referred back to PCP.    States passing gas and last BM 08/28/2020.

## 2020-08-29 NOTE — Telephone Encounter (Signed)
New message    Patient is asking for a call back to discuss her options on a sooner appt .   C/o stomach pain  recent ED visit  been out of work - employer is asking for [employer]

## 2020-08-29 NOTE — ED Notes (Signed)
Patient transported to CT 

## 2020-08-29 NOTE — Telephone Encounter (Signed)
Patient scheduled an appt today for Hospital Follow up at 1120 with Dr Ronnald Ramp. Called patient for CMA Baxter Flattery as asked to inform her Dr. Ronnald Ramp cannot see her for her groin pain since she has already seen the [facility] doctor.   Told her to call her GI doctor if she is having the problems. She was very upset and started off by saying " You know what, You'll are going to keep on and I am going to file a formal complaint on you all." She said she "should be able to see my damn doctor if I want to."  Explained to her she can see her, but we did not want her to come here if there is nothing we can do for her. Explained to her that I am just relaying a message that was given to me and I am not Dr Ronnald Ramp normal nurse.  She said she already spoke with GI this morning, and she seen GYN last week and both told her to see her PCP.  She said she is just trying to follow "simple instructions" from her other doctor.  Placed patient on hold. Informed Nurse Baxter Flattery, and Dr Ronnald Ramp. They said they will just see the patient, but if its groin pain then she will need to be referred back to GYN.  Informed patient of this information, and she was very upset. Said she has been trying to manage her pain with just tylenol, and that she will be here at her appt and the phone call ended abruptly.

## 2020-08-29 NOTE — ED Provider Notes (Signed)
[facility] Emergency Department Provider Note  ____________________________________________   Event Date/Time   First MD Initiated Contact with Patient 08/29/20 1354     (approximate)  I have reviewed the triage vital signs and the nursing notes.   HISTORY  Chief Complaint No chief complaint on file.   HPI Kathryn Santos is a 45 y.o. female with a past medical history of TIA, thyroid disease, obesity, PTSD, endometrial ablation and tubal ligation reversed with plans to try to become pregnant at some point in the future who presents for assessment approximately 1 week of fairly severe unremitting left lower quadrant abdominal pain.  Patient states her symptoms really began suddenly waking her from sleep early in the morning on 7/12 after she started the prep for a pill endoscopy.  She states she completed the prep and underwent endoscopy and was told her pain is not related to her GI tract.  She states the pain was so severe that on 13th she went to the emergency department at [facility] in [place] and had a CT and was told that she had a little bit of free fluid but otherwise I did not find any explanation for her abdominal pain.  She states she subsequently back to GI follow-up was not related to her GI tract as well as being seen by her gynecologist on the 15th who performed a pelvic exam and ultrasound this demonstrated reportedly good flow to bilateral ovaries and no obvious gynecological reason for her pain.  She states she went back to her PCP today who referred her to the emergency room as no one seems to be able to figure out why she is hurting.  She states that after having completed her bowel prep she has been having regular bowel movements and has not had significant constipation or diarrhea.  She states the pain feels mostly localized in the left lower quadrant and does not represent pain in the other quadrants of abdomen back chest or extremities.  She  denies any cough, acute headache, chest pain, shortness of breath, fevers, vomiting or blood in her urine or stool.  States that sometimes feels like it is "pulling when she is urinating but does not have any burning when she is urinating per se.  She has been taking Tylenol and dicyclomine which have not significantly improved her pain.  Her pain is aggravated by walking.  She denies any significant EtOH use.  No other clear acute alleviating or aggravating factors.         Past Medical History:  Diagnosis Date   Family history of breast cancer    Family history of cervical cancer    Family history of colon cancer    Family history of lung cancer    Thyroid disease    TIA (transient ischemic attack) 2013   Vaginal Pap smear, abnormal     Patient Active Problem List   Diagnosis Date Noted   Genetic testing 08/19/2020   Family history of colon cancer 08/08/2020   Family history of lung cancer 08/08/2020   Family history of breast cancer 08/08/2020   Family history of cervical cancer 08/08/2020   PTSD (post-traumatic stress disorder) 07/08/2020   Generalized anxiety disorder 07/08/2020   Infertility, female 02/02/2020   Cervical os stenosis 07/02/2017   Class 1 obesity due to excess calories without serious comorbidity with body mass index (BMI) of 33.0 to 33.9 in adult 06/26/2017   History of cholecystectomy 06/26/2017   Postoperative hypothyroidism 06/26/2017  Past Surgical History:  Procedure Laterality Date   CHOLECYSTECTOMY     ENDOMETRIAL ABLATION  2015   FLUOROSCOPIC TUBAL RECANNULATUON     GALLBLADDER SURGERY     THYROIDECTOMY  02/2007   TUBAL LIGATION      Prior to Admission medications   Medication Sig Start Date End Date Taking? Authorizing Provider  cephALEXin (KEFLEX) 500 MG capsule Take 1 capsule (500 mg total) by mouth 4 (four) times daily for 5 days. 08/29/20 09/03/20 Yes Lucrezia Starch, MD  HYDROcodone-acetaminophen (NORCO) 5-325 MG tablet Take 2 tablets  by mouth every 6 (six) hours as needed for up to 3 days for severe pain. 08/29/20 09/01/20 Yes Lucrezia Starch, MD  acetaminophen (TYLENOL) 500 MG tablet Take 500 mg by mouth every 6 (six) hours as needed.    [provider]  dicyclomine (BENTYL) 10 MG capsule Take 10 mg by mouth every 4 (four) hours.    [provider]  gabapentin (NEURONTIN) 100 MG capsule Take 1 capsule (100 mg total) by mouth 3 (three) times daily as needed (high anxiety). Patient taking differently: Take 100 mg by mouth 3 (three) times daily as needed (high anxiety). ER 07/08/20   Patrecia Pour, NP  hydrOXYzine (ATARAX/VISTARIL) 10 MG tablet Take 1 tablet (10 mg total) by mouth 3 (three) times daily as needed for anxiety. Patient taking differently: Take 10 mg by mouth 3 (three) times daily as needed for anxiety. ER 07/08/20   Patrecia Pour, NP  pantoprazole (PROTONIX) 40 MG tablet TAKE 1 TABLET BY MOUTH EVERY DAY 07/04/20   Thornton Park, MD  prenatal vitamin w/FE, FA (PRENATAL 1 + 1) 27-1 MG TABS tablet Take 1 tablet by mouth daily at 12 noon.    [provider]  pyridoxine (B-6) 100 MG tablet Take 100 mg by mouth daily. Patient not taking: Reported on 08/29/2020    [provider]    Allergies Dilaudid [hydromorphone], Zithromax [azithromycin], and Mushroom extract complex  Family History  Problem Relation Age of Onset   Diabetes Mother    Hypertension Father    Colon cancer Father 95   Cancer Father    Heart disease Father    Cervical cancer Sister    Diabetes Maternal Aunt    Diabetes Maternal Uncle    Hypertension Paternal Uncle    Diabetes Maternal Grandmother    Stroke Maternal Grandmother    Diabetes Maternal Grandfather    Hypertension Maternal Grandfather    Hypertension Paternal Grandfather    Cancer Paternal Grandfather        lung   Heart disease Paternal Grandfather    Breast cancer Other    Breast cancer Cousin        dx 37s   Esophageal cancer Neg Hx     Rectal cancer Neg Hx     Social History Social History   Tobacco Use   Smoking status: Never   Smokeless tobacco: Never  Vaping Use   Vaping Use: Never used  Substance Use Topics   Alcohol use: Not Currently   Drug use: Not Currently    Review of Systems  Review of Systems  Constitutional:  Negative for chills and fever.  HENT:  Negative for sore throat.   Eyes:  Negative for pain.  Respiratory:  Negative for cough and stridor.   Cardiovascular:  Negative for chest pain.  Gastrointestinal:  Positive for abdominal pain. Negative for vomiting.  Genitourinary:  Negative for dysuria.  Musculoskeletal:  Negative for  myalgias.  Skin:  Negative for rash.  Neurological:  Negative for seizures, loss of consciousness and headaches.  Psychiatric/Behavioral:  Negative for suicidal ideas.   All other systems reviewed and are negative.    ____________________________________________   PHYSICAL EXAM:  VITAL SIGNS: ED Triage Vitals  Enc Vitals Group     BP 08/29/20 1249 (!) 152/104     Pulse Rate 08/29/20 1249 (!) 108     Resp 08/29/20 1249 20     Temp 08/29/20 1249 98 F (36.7 C)     Temp src --      SpO2 08/29/20 1249 96 %     Weight 08/29/20 1254 186 lb 15.2 oz (84.8 kg)     Height 08/29/20 1254 5\' 2"  (1.575 m)     Head Circumference --      Peak Flow --      Pain Score 08/29/20 1253 10     Pain Loc --      Pain Edu? --      Excl. in [place]? --    Vitals:   08/29/20 1249 08/29/20 1617  BP: (!) 152/104 (!) 144/94  Pulse: (!) 108 91  Resp: 20 18  Temp: 98 F (36.7 C)   SpO2: 96% 99%   Physical Exam Vitals and nursing note reviewed.  Constitutional:      Appearance: She is well-developed.  HENT:     Head: Normocephalic and atraumatic.     Right Ear: External ear normal.     Left Ear: External ear normal.     Nose: Nose normal.  Eyes:     Conjunctiva/sclera: Conjunctivae normal.  Cardiovascular:     Rate and Rhythm: Normal rate and regular rhythm.      Heart sounds: No murmur heard. Pulmonary:     Effort: Pulmonary effort is normal. No respiratory distress.     Breath sounds: Normal breath sounds.  Abdominal:     Palpations: Abdomen is soft.     Tenderness: There is abdominal tenderness in the left lower quadrant. There is guarding and rebound. There is no right CVA tenderness or left CVA tenderness.  Musculoskeletal:     Cervical back: Neck supple.  Skin:    General: Skin is warm and dry.     Capillary Refill: Capillary refill takes less than 2 seconds.  Neurological:     Mental Status: She is alert and oriented to person, place, and time.     ____________________________________________   LABS (all labs ordered are listed, but only abnormal results are displayed)  Labs Reviewed  URINALYSIS, COMPLETE (UACMP) WITH MICROSCOPIC - Abnormal; Notable for the following components:      Result Value   Color, Urine YELLOW (*)    APPearance HAZY (*)    Protein, ur 30 (*)    Leukocytes,Ua MODERATE (*)    Bacteria, UA RARE (*)    All other components within normal limits  URINE CULTURE  LIPASE, BLOOD  COMPREHENSIVE METABOLIC PANEL  CBC  POC URINE PREG, ED   ____________________________________________  EKG  ____________________________________________  RADIOLOGY  ED MD interpretation: CTA abdomen pelvis shows no evidence of ischemia, appendicitis, diverticulitis, partial SBO, internal hernia or other clear acute process.  There are some surgical clips present in the pelvic cul-de-sac and the rectouterine recess possibly representing a displaced clips from prior cholecystectomy or prior tubal ligation.  No abscess or localized inflammation or other clear acute abdominopelvic process.  Official radiology report(s): CT Angio Abd/Pel W and/or Wo Contrast  Result Date: 08/29/2020 CLINICAL DATA:  Left lower quadrant abdominal pain for 1 week. Possible mesenteric ischemia. EXAM: CTA ABDOMEN AND PELVIS WITHOUT AND WITH CONTRAST  TECHNIQUE: Multidetector CT imaging of the abdomen and pelvis was performed using the standard protocol during bolus administration of intravenous contrast. Multiplanar reconstructed images and MIPs were obtained and reviewed to evaluate the vascular anatomy. CONTRAST:  150mL OMNIPAQUE IOHEXOL 350 MG/ML SOLN COMPARISON:  CT abdomen/pelvis 08/24/2020 FINDINGS: VASCULAR Aorta: Normal caliber aorta without aneurysm, dissection, vasculitis or significant stenosis. Celiac: Patent without evidence of aneurysm, dissection, vasculitis or significant stenosis. SMA: Patent without evidence of aneurysm, dissection, vasculitis or significant stenosis. Renals: Both renal arteries are patent without evidence of aneurysm, dissection, vasculitis, fibromuscular dysplasia or significant stenosis. IMA: Patent without evidence of aneurysm, dissection, vasculitis or significant stenosis. Inflow: Patent without evidence of aneurysm, dissection, vasculitis or significant stenosis. Proximal Outflow: Bilateral common femoral and visualized portions of the superficial and profunda femoral arteries are patent without evidence of aneurysm, dissection, vasculitis or significant stenosis. Veins: No focal venous abnormality.  Circumaortic left renal vein. Review of the MIP images confirms the above findings. NON-VASCULAR Lower chest: No acute abnormality. Calcified granuloma in the lingula. Hepatobiliary: No focal liver abnormality is seen. Status post cholecystectomy. No biliary dilatation. There are a few punctate parenchymal calcifications consistent with sequelae of old granulomatous disease. Pancreas: Unremarkable. No pancreatic ductal dilatation or surrounding inflammatory changes. Spleen: Scattered parenchymal calcifications consistent with old granulomatous disease. Adrenals/Urinary Tract: Adrenal glands are unremarkable. Kidneys are normal, without renal calculi, focal lesion, or hydronephrosis. Bladder is unremarkable. Stomach/Bowel:  Stomach is within normal limits. Appendix appears normal. No evidence of bowel wall thickening, distention, or inflammatory changes. A metallic clip is present in the pelvic cul-de-sac adjacent to the rectum. Lymphatic: No suspicious lymphadenopathy. Reproductive: Uterus and bilateral adnexa are unremarkable. The Other: Trace free fluid in the pelvis is likely physiologic in nature. Musculoskeletal: No acute or significant osseous findings. IMPRESSION: VASCULAR No findings to suggest chronic or acute mesenteric ischemia. NON-VASCULAR 1. No acute abnormality within the abdomen or pelvis. 2. Sequelae of old granulomatous disease within the lingula, spleen and liver. 3. Surgical clips present in the pelvic cul-de-sac in the rectouterine recess. This may represent a displaced clip from prior cholecystectomy, or a displaced clip from prior tubal ligation. Electronically Signed   By: Jacqulynn Cadet M.D.   On: 08/29/2020 16:01    ____________________________________________   PROCEDURES  Procedure(s) performed (including Critical Care):  Procedures   ____________________________________________   INITIAL IMPRESSION / ASSESSMENT AND PLAN / ED COURSE     Patient presents with above-stated history exam for assessment of some persistent left lower quadrant pain has been ongoing for about a week without a clear etiology identified after she has been evaluated by GI x2, in the ED on 7/13, by her gynecologist on 7/15 and by her PCP today.  Patient states she is quite frustrated because no one still feels like I was going on.  On arrival she is slight tachycardic and hypertensive with a heart rate of 108 and a BP of 150/104 with otherwise stable vital signs on room air.  She is quite tender in left lower quadrant with some localized peritoneal findings but no diffuse peritonitis or tenderness in the other quadrants.  No significant CVA tenderness.  Differential includes possible developing SBO, torsion,  internal hernia, mesenteric ischemia, kidney stone, cystitis, pyelonephritis, symptomatic scar tissue from prior gynecological procedures, ovarian cyst and others.   CTA abdomen pelvis shows  no evidence of ischemia, appendicitis, diverticulitis, partial SBO, internal hernia or other clear acute process.  There are some surgical clips present in the pelvic cul-de-sac and the rectouterine recess possibly representing a displaced clips from prior cholecystectomy or prior tubal ligation.  No abscess or localized inflammation or other clear acute abdominopelvic process.  Overall given unremarkable bilateral adnexa and I have a low suspicion for torsion there is no evidence of TOA.  Per patient she had a normal exam and Doppler ultrasound on the 15th thus given otherwise normal CTA of low suspicion for acute vascular process or other clear acute ovarian process.  Lipase not consistent with acute pancreatitis.  CBC shows no leukocytosis or acute anemia.  CMP shows no significant electrolyte or metabolic derangements or evidence of hepatitis or cholestasis.  Urine pregnancy test is negative.  UA with 30 protein and moderate leukocyte esterase with rare bacteria.  Given no clear source of patient's pain with findings on CTA of some surgical clips that possibly may be out of place discussed with on-call gynecologist Dr. Glennon Mac who reviewed patient's CT and felt that these clips are likely not the source of the pain unlikely incidental finding.  Given no other clear source of pain with somewhat equivocal UA findings and patient describing a pulling sensation with urination we will treat for possible cystitis although advised patient that I do not have a clear source of her discomfort and advised close outpatient PCP follow-up.  Will write short course of Norco and antibiotics for possible UTI.  Given duration of symptoms with no fever, no leukocytosis and otherwise normal CT of abdomen pelvis I think she is stable for  close outpatient follow-up given pain is mildly improved on reassessment.  Discharged stable condition.  Strict return precautions advised and discussed.     ____________________________________________   FINAL CLINICAL IMPRESSION(S) / ED DIAGNOSES  Final diagnoses:  LLQ pain    Medications  fentaNYL (SUBLIMAZE) injection 100 mcg (100 mcg Intravenous Given 08/29/20 1522)  iohexol (OMNIPAQUE) 350 MG/ML injection 100 mL (100 mLs Intravenous Contrast Given 08/29/20 1530)  acetaminophen (TYLENOL) tablet 1,000 mg (1,000 mg Oral Given 08/29/20 1615)  ondansetron (ZOFRAN) injection 4 mg (4 mg Intravenous Given 08/29/20 1614)     ED Discharge Orders          Ordered    HYDROcodone-acetaminophen (NORCO) 5-325 MG tablet  Every 6 hours PRN        08/29/20 1648    cephALEXin (KEFLEX) 500 MG capsule  4 times daily        08/29/20 1648             Note:  This document was prepared using Dragon voice recognition software and may include unintentional dictation errors.    Lucrezia Starch, MD 08/29/20 [phone]

## 2020-08-29 NOTE — Telephone Encounter (Signed)
It was noticed that patient was on the schedule for an 11:20 appt for hospital follow up/ abdominal pain. Alonza Smoker called the patient and explained since she has a GI doctor, and just saw them on 7/15- would be best to get back in touch with them. Alonza Smoker called back here stating the patient said, "She is coming in to her appointment." I asked Chassidy to call the patient and explain why she needed to go to GI or GYN as stated in the GI doctor's note. I called the infertility doctor's office to ask if patient has been seen there for abdominal pain. They did not answer, so I had to leave a message. I also called the OBGYN clinic at [facility], that she has seen. I was able to speak to the nurse concerning this patient. She stated that she was aware of this patient, but she doesn't have any openings until August. Together, we looked at her CT scan that was done last week and overall there were no abnormalities. The nurse also told me when pt was seen in May, with their office, she had been complaining of this same pain since January. So far, according to the nurse, there has been nothing to explain this abdominal pain she is having. Including the GI findings. Chassidy called the patient to explain the reasoning behind her needing to go to GYN and I heard Chassidy saying, "Ma'am, ma'am, if you would just let me finish." Chassidy put the patient on hold and stated, "She is cursing at me and will not let me speak." Dr. Ronnald Ramp agreed to just see the patient in office and refer to GYN. Upon calling the patient back, she wanted her husband to come back, I explained to her due to some questions, we will bring her back and then get him back to go over everything. I brought patient back to the room and was going over everything with her. She wanted to know why she was called to cancel the appt. I told her we couldn't do anything for severe abdominal pain that hadn't been done already. We were just trying to save her the time for  a visit. I also asked if she went to her appt with the infertility doctor and she said yes. I did tell her I tried to call them, but no answer so I had to leave a message. I also went over calling the OBGYN office and what they said. I told her, "The nurse said they offered you an August appt. And you didn't take it. She also said that if she saw anything abnormal on the CT she could've squeezed you in, but it looks ok." The patient became angry and was refusing anything further from me, including answering the PHQ and GAD questions. I sent Amy a chat to come to Room 9 and talk to her. I told the patient, "The office manager will be here in a second to discuss things further with you." Amy opened the door to speak to the patient. I told Amy, "She is refusing to answer these questions, so I am not sure what to do." Amy was explaining why we needed the questions and the patient starting raising her voice stating, "Why should I have to answer the same questions that she just asked me in May?!" Amy explained that, "They have to ask you these questions when you come in." Patient raised her voice more and Amy said, "Calm down." The patient hollered, "I am not answering anymore questions  and don't tell me what the F%&@ to do!"  Amy went to call security. Patient said, "I will talk to doctor, but not anybody else!" Dr. Ronnald Ramp and I went back into the room." Patient was seen, while security was outside the room. Raising her voice off and on, Jones asked the questions for an evaluation/physical exam. Patient's heart and lungs were listened to, conjunctiva looked at and she was layed back in supine position. Dr. Ronnald Ramp palpated her abdomen and mashed in the area of concern. She was checked for rebound tenderness, which she had none, and then was helped back up. She complained her pain was a 9 to a 10 and was crying when sitting up and taking deep breaths. Dr. Ronnald Ramp explained that she would, "Need to go for further evaluation to  the hospital, but I can get a urinalysis, back xray and labs STAT today." Patient picked her pocketbook up and said, "I will just go to ER." I offered her a wheelchair and she refused.

## 2020-08-29 NOTE — Progress Notes (Signed)
Date:  08/29/2020   Name:  Kathryn Santos   DOB:  10-30-1975   MRN:  [id number]   Chief Complaint: Abdominal Pain  Abdominal Pain This is a recurrent problem. The current episode started in the past 7 days. The onset quality is sudden (out of sleep). The problem occurs constantly. The problem has been waxing and waning. The pain is located in the LLQ. The pain is at a severity of 9/10. The pain is moderate. The quality of the pain is sharp. The abdominal pain does not radiate. Associated symptoms include constipation, flatus and nausea. Pertinent negatives include no diarrhea, dysuria, fever, frequency, hematochezia, hematuria, melena, vomiting or weight loss. Associated symptoms comments: Constipation in past/currently stable/. The pain is aggravated by movement. The pain is relieved by Nothing. Prior diagnostic workup includes CT scan, GI consult, upper endoscopy, ultrasound and lower endoscopy.   Lab Results  Component Value Date   CREATININE 1.03 (H) 08/23/2020   BUN 14 08/23/2020   NA 138 08/23/2020   K 4.1 08/23/2020   CL 104 08/23/2020   CO2 24 08/23/2020   No results found for: CHOL, HDL, LDLCALC, LDLDIRECT, TRIG, CHOLHDL No results found for: TSH No results found for: HGBA1C Lab Results  Component Value Date   WBC 9.0 08/23/2020   HGB 14.1 08/23/2020   HCT 42.6 08/23/2020   MCV 92.4 08/23/2020   PLT 290 08/23/2020   Lab Results  Component Value Date   ALT 23 08/23/2020   AST 25 08/23/2020   ALKPHOS 84 08/23/2020   BILITOT 0.3 08/23/2020     Review of Systems  Constitutional:  Positive for fatigue. Negative for fever, unexpected weight change and weight loss.  Respiratory:  Negative for shortness of breath.   Cardiovascular:  Negative for chest pain and palpitations.  Gastrointestinal:  Positive for abdominal pain, constipation, flatus and nausea. Negative for diarrhea, hematochezia, melena and vomiting.  Genitourinary:  Negative for difficulty urinating,  dysuria, frequency, hematuria, menstrual problem, urgency, vaginal bleeding and vaginal discharge.   Patient Active Problem List   Diagnosis Date Noted   Genetic testing 08/19/2020   Family history of colon cancer 08/08/2020   Family history of lung cancer 08/08/2020   Family history of breast cancer 08/08/2020   Family history of cervical cancer 08/08/2020   PTSD (post-traumatic stress disorder) 07/08/2020   Generalized anxiety disorder 07/08/2020   Infertility, female 02/02/2020   Cervical os stenosis 07/02/2017   Class 1 obesity due to excess calories without serious comorbidity with body mass index (BMI) of 33.0 to 33.9 in adult 06/26/2017   History of cholecystectomy 06/26/2017   Postoperative hypothyroidism 06/26/2017    Allergies  Allergen Reactions   Dilaudid [Hydromorphone] Itching and Nausea And Vomiting   Zithromax [Azithromycin] Hives and Other (See Comments)    Stiffness in face   Mushroom Extract Complex Hives    Past Surgical History:  Procedure Laterality Date   CHOLECYSTECTOMY     ENDOMETRIAL ABLATION  2015   FLUOROSCOPIC TUBAL [place]     THYROIDECTOMY  02/2007   TUBAL LIGATION      Social History   Tobacco Use   Smoking status: Never   Smokeless tobacco: Never  Vaping Use   Vaping Use: Never used  Substance Use Topics   Alcohol use: Not Currently   Drug use: Not Currently     Medication list has been reviewed and updated.  Current Meds  Medication Sig   acetaminophen (  TYLENOL) 500 MG tablet Take 500 mg by mouth every 6 (six) hours as needed.   dicyclomine (BENTYL) 10 MG capsule Take 10 mg by mouth every 4 (four) hours.   gabapentin (NEURONTIN) 100 MG capsule Take 1 capsule (100 mg total) by mouth 3 (three) times daily as needed (high anxiety). (Patient taking differently: Take 100 mg by mouth 3 (three) times daily as needed (high anxiety). ER)   hydrOXYzine (ATARAX/VISTARIL) 10 MG tablet Take 1 tablet (10 mg  total) by mouth 3 (three) times daily as needed for anxiety. (Patient taking differently: Take 10 mg by mouth 3 (three) times daily as needed for anxiety. ER)   pantoprazole (PROTONIX) 40 MG tablet TAKE 1 TABLET BY MOUTH EVERY DAY   prenatal vitamin w/FE, FA (PRENATAL 1 + 1) 27-1 MG TABS tablet Take 1 tablet by mouth daily at 12 noon.   Current Facility-Administered Medications for the 08/29/20 encounter (Office Visit) with Juline Patch, MD  Medication   0.9 %  sodium chloride infusion    PHQ 2/9 Scores 06/28/2020  PHQ - 2 Score 0  PHQ- 9 Score 1    GAD 7 : Generalized Anxiety Score 06/28/2020  Nervous, Anxious, on Edge 1  Control/stop worrying 1  Worry too much - different things 0  Trouble relaxing 0  Restless 0  Easily annoyed or irritable 0  Afraid - awful might happen 0  Total GAD 7 Score 2  Anxiety Difficulty Not difficult at all    BP Readings from Last 3 Encounters:  08/26/20 (!) 134/94  08/24/20 (!) 144/98  07/08/20 (!) 142/98    Physical Exam Vitals and nursing note reviewed. Exam conducted with a chaperone present.  HENT:     Mouth/Throat:     Mouth: Mucous membranes are moist.     Pharynx: No pharyngeal swelling or oropharyngeal exudate.  Eyes:     Extraocular Movements: Extraocular movements intact.     Pupils: Pupils are equal, round, and reactive to light.  Cardiovascular:     Rate and Rhythm: Normal rate and regular rhythm.     Heart sounds: No murmur heard.   No friction rub. No gallop.  Pulmonary:     Effort: Pulmonary effort is normal. No respiratory distress.     Breath sounds: Normal breath sounds. No stridor. No wheezing, rhonchi or rales.  Abdominal:     General: Bowel sounds are normal. There is no distension or abdominal bruit.     Palpations: There is no hepatomegaly or splenomegaly.     Tenderness: There is abdominal tenderness in the left lower quadrant. There is guarding. There is no rebound.     Comments: Pain in LLQ with palpation of  suprapubic/ and RLQ  Neurological:     Mental Status: She is alert.    Wt Readings from Last 3 Encounters:  08/29/20 187 lb (84.8 kg)  08/26/20 188 lb 8 oz (85.5 kg)  08/23/20 185 lb (83.9 kg)    Ht 5\' 2"  (1.575 m)   Wt 187 lb (84.8 kg)   LMP 06/12/2020 Comment: "just not having a period"  BMI 34.20 kg/m   Assessment and Plan:  1. Left lower quadrant abdominal pain Chronic.  Uncertain recurrentcy.  waxes and wanes..  Complicated.  Currently pain is referred to as a 9/10.  It was explained to the patient prior to examination that if exam allowed my plan would be to check a urinalysis, stat CBC and renal panel, and likely an urgent LS spine  x-ray to rule out compression fracture radicular pain.  The subject of medication was not discussed prior to evaluation of the patient.  Upon examination patient had exquisite tenderness in the left lower quadrant with guarding.  There was pain that radiated to the left lower quadrant when palpating in the right lower quadrant and suprapubic.  There was no rebound tenderness.  At this point of the exam I told the patient it would be my suggestion that she go to an emergency room setting with this much tenderness and this much pain to be evaluated and possibly have repeat laboratory and scans if necessary.  After hearing this patient picked up her pocketbook and said that she would go to the emergency room.  We offered assistance with the wheelchair and patient declined.  Patient was upset that they were going to cancel her appointment but explained to her that if a patient is having abdominal pain (the same goes for chest pain, severe headache, shortness of breath, or neurologic symptoms) that we request that they go to an emergency room for evaluation and that it is difficult to do this type of evaluation in a clinical setting.  I also explained that the appointment was scheduled as a hospital follow-up and that we had specific times for hospital follow-up  should allow more time for evaluation.  Ultimately the patient was allowed to come in for evaluation and patient was seen by myself.                                           I also explained to the patient that her appointment was made at 9:40 AM in a same-day slot and that we had roughly 2 hours to determine if we were going to be able to accommodate her acuity of symptom.  The suggestion was made that if she was having abdominal pain that she would be best evaluated in an emergency room setting.  Although I was accused of being rude and that because and  I pointed to her I told her that I speak with my hands as well and will sometimes of lift my finger. I do not remember specifically pointing my finger at her.  At no time did I raise my voice or use rude or cursed language.  I have allowed her to finish her sentences after she pointed out that I interrupted her, but she continued to interrupt me with my explanations. We did review all of her evaluations including GI, GYN, and infertility.  I pointed out to her that I knew that she was to have an appointment in [facility] with her infertility doctor but that I did not have access to that evaluation at this time and we have tried to call and it may require a request of information permission from her.  We did call and leave a message and no one answered the phone at the infertility office to request information while the patient was in the office.

## 2020-08-30 ENCOUNTER — Encounter: Payer: Self-pay | Admitting: Family Medicine

## 2020-08-31 LAB — URINE CULTURE: Culture: >=100000 — AB

## 2020-09-01 NOTE — Progress Notes (Signed)
ED Antimicrobial Stewardship Positive Culture Follow Up   Kathryn Santos is an 45 y.o. female who presented to [facility] on 08/29/2020 with a chief complaint of abd pain   Recent Results (from the past 720 hour(s))  Urine Culture     Status: Abnormal   Collection Time: 08/29/20  4:42 PM   Specimen: Urine, Random  Result Value Ref Range Status   Specimen Description   Final    URINE, RANDOM Performed at [facility], [address], [place], [place] [address]    Special Requests   Final    NONE Performed at [facility], [address] [place], [place] [address]    Culture (A)  Final    >=100,000 COLONIES/mL LACTOBACILLUS SPECIES Standardized susceptibility testing for this organism is not available. Performed at [facility], [place] [address] [place], [place] [address]    Report Status 08/31/2020 FINAL  Final    Patient was discharged on cephalexin for possible cystitis. Discussed culture with ED MD Suspect this is contamination. No new orders at this time.  ED Provider: Darrick Grinder A 09/01/2020, 3:33 PM Clinical Pharmacist

## 2020-10-03 ENCOUNTER — Other Ambulatory Visit: Payer: Self-pay | Admitting: Gastroenterology

## 2020-10-03 DIAGNOSIS — R194 Change in bowel habit: Secondary | ICD-10-CM

## 2020-10-03 DIAGNOSIS — R110 Nausea: Secondary | ICD-10-CM

## 2020-10-03 DIAGNOSIS — Z800 Family history of malignant neoplasm of digestive organs: Secondary | ICD-10-CM

## 2020-10-03 DIAGNOSIS — R140 Abdominal distension (gaseous): Secondary | ICD-10-CM

## 2020-10-05 ENCOUNTER — Ambulatory Visit: Payer: Managed Care, Other (non HMO) | Admitting: Gastroenterology

## 2020-10-10 ENCOUNTER — Other Ambulatory Visit (HOSPITAL_COMMUNITY): Payer: Self-pay | Admitting: Psychiatry

## 2020-10-25 ENCOUNTER — Ambulatory Visit: Payer: Managed Care, Other (non HMO) | Admitting: Family Medicine

## 2020-11-03 ENCOUNTER — Encounter: Payer: Self-pay | Admitting: General Practice

## 2020-11-30 ENCOUNTER — Other Ambulatory Visit: Payer: Self-pay

## 2020-11-30 ENCOUNTER — Encounter: Payer: Self-pay | Admitting: Family Medicine

## 2020-11-30 ENCOUNTER — Ambulatory Visit (INDEPENDENT_AMBULATORY_CARE_PROVIDER_SITE_OTHER): Payer: Managed Care, Other (non HMO) | Admitting: Family Medicine

## 2020-11-30 VITALS — BP 142/95 | HR 78 | Ht 62.0 in | Wt 191.8 lb

## 2020-11-30 DIAGNOSIS — N912 Amenorrhea, unspecified: Secondary | ICD-10-CM | POA: Diagnosis not present

## 2020-11-30 DIAGNOSIS — Z3189 Encounter for other procreative management: Secondary | ICD-10-CM | POA: Insufficient documentation

## 2020-11-30 DIAGNOSIS — Z1231 Encounter for screening mammogram for malignant neoplasm of breast: Secondary | ICD-10-CM | POA: Insufficient documentation

## 2020-11-30 DIAGNOSIS — N979 Female infertility, unspecified: Secondary | ICD-10-CM | POA: Diagnosis not present

## 2020-11-30 NOTE — Assessment & Plan Note (Signed)
Kathryn Santos Plate discussion with patient that between her age, prior endometrial ablation, lack of menses recently, and tubal reversal, she is facing significant challenges to achieving another pregnancy and the chances of this occurring naturally without significant medical and financial interventions are small. Patient disappointed to hear this but reports she appreciates the honesty of this answer. She is not totally ready to stop trying at this time but is strongly considering doing so.

## 2020-11-30 NOTE — Assessment & Plan Note (Signed)
Not totally clear why she has not had a period for four months. Possibly related to recent procedure but would need to be confirmed with hysteroscopy, referred to Dr. Kennon Rounds for further discussion of this procedure. Possibly menopause starting though does not have a strong family hx. Basic lab workup ordered, unfortunately she was an exceedingly hard stick and I had to draw the labs myself with ultrasound guidance, carefully consider need for future lab draws.

## 2020-11-30 NOTE — Assessment & Plan Note (Signed)
Reports normal mammogram last year but not on file, regardless is due, repeat ordered today.

## 2020-11-30 NOTE — Progress Notes (Signed)
GYNECOLOGY OFFICE VISIT NOTE  History:   Kathryn Santos is a 45 y.o. [phone] here today for multiple issues.  Would like to establish care with ob/gyn and make sure she doesn't need anything health maintenance wise done  Secondly wants to discuss her fertility Patient has had an endometrial ablation and tubal ligation in the past Has remarried and she and husband would like to have children together if possible She had a tubal reversal last year Earlier this year had HSG after seeing Dr. Kennon Rounds which showed patent tubes Then started seeing fertility doctor Underwent procedure to "remove scar tissue" with Dr. Kerin Perna in June Since then she has not had a period, but when it is time for her menses she has intense period pain She went to another doctor and was accused of being pain medicine seeking She went to see her PCP and was told that likely secondary to the procedure there is now more scar tissue and this is preventing her menses from escaping which is causing her pain Overall she is very distraught over her experiences of the past year and in particular the last couple of months She has been told likely her only options are expensive methods such as IVF/donor eggs/surrogacy which she can not afford She wants to discuss the likelihood she can actually get pregnant  Also concerned about her lack of menses for the past four months and what kind of workup can be done She has not been using any ovulation kits Only other symptom of note is that she has been having a tingling sensation in her breasts, similar to the sensation of let down she has had in the past when breastfeeding her children Reports she was at a wedding this past week with other women from her family and age of menopause came up Had a maternal aunt who had menopause in mid 16's but most of the women in her family had it in their 36's  Health Maintenance Due  Topic Date Due   Pneumococcal Vaccine 51-23 Years old (1 -  PCV) Never done   HIV Screening  Never done   INFLUENZA VACCINE  09/12/2020    Past Medical History:  Diagnosis Date   Family history of breast cancer    Family history of cervical cancer    Family history of colon cancer    Family history of lung cancer    Thyroid disease    TIA (transient ischemic attack) 2013   Vaginal Pap smear, abnormal     Past Surgical History:  Procedure Laterality Date   CHOLECYSTECTOMY     ENDOMETRIAL ABLATION  2015   FLUOROSCOPIC TUBAL [place]     THYROIDECTOMY  02/2007   TUBAL LIGATION      The following portions of the patient's history were reviewed and updated as appropriate: allergies, current medications, past family history, past medical history, past social history, past surgical history and problem list.   Health Maintenance:   Last pap: No results found for: DIAGPAP, HPV, [place] Reportedly normal at outside office in 01/2020  Last mammogram:  None on file  Reports normal in 11/2019  Review of Systems:  Pertinent items noted in HPI and remainder of comprehensive ROS otherwise negative.  Physical Exam:  BP (!) 142/95   Pulse 78   Ht 5\' 2"  (1.575 m)   Wt 191 lb 12.8 oz (87 kg)   LMP 11/07/2020 (Exact Date) Comment: not normal cycle since May 2022. Uterine  ablation 2017  BMI 35.08 kg/m  CONSTITUTIONAL: Well-developed, well-nourished female in no acute distress.  HEENT:  Normocephalic, atraumatic. External right and left ear normal. No scleral icterus.  NECK: Normal range of motion, supple, no masses noted on observation SKIN: No rash noted. Not diaphoretic. No erythema. No pallor. MUSCULOSKELETAL: Normal range of motion. No edema noted. NEUROLOGIC: Alert and oriented to person, place, and time. Normal muscle tone coordination.  PSYCHIATRIC: Normal mood and affect. Normal behavior. Normal judgment and thought content. RESPIRATORY: Effort normal, no problems with respiration noted  Labs and  Imaging No results found for this or any previous visit (from the past 168 hour(s)). No results found.    Assessment and Plan:   Problem List Items Addressed This Visit       Genitourinary   Infertility, female - Primary    [place] Plate discussion with patient that between her age, prior endometrial ablation, lack of menses recently, and tubal reversal, she is facing significant challenges to achieving another pregnancy and the chances of this occurring naturally without significant medical and financial interventions are small. Patient disappointed to hear this but reports she appreciates the honesty of this answer. She is not totally ready to stop trying at this time but is strongly considering doing so.          Other   Amenorrhea    Not totally clear why she has not had a period for four months. Possibly related to recent procedure but would need to be confirmed with hysteroscopy, referred to Dr. Kennon Rounds for further discussion of this procedure. Possibly menopause starting though does not have a strong family hx. Basic lab workup ordered, unfortunately she was an exceedingly hard stick and I had to draw the labs myself with ultrasound guidance, carefully consider need for future lab draws.       Relevant Orders   Prolactin   TSH   Beta hCG quant (ref lab)   [facility]   Encounter for screening mammogram for malignant neoplasm of breast    Reports normal mammogram last year but not on file, regardless is due, repeat ordered today.       Relevant Orders   MM Digital Screening    Routine preventative health maintenance measures emphasized. Please refer to After Visit Summary for other counseling recommendations.   Return in about 2 weeks (around 12/14/2020) for discuss hysteroscopy.    Total face-to-face time with patient: 30 minutes.  Over 50% of encounter was spent on counseling and coordination of care.   Clarnce Flock, MD/MPH Attending Family Medicine Physician, [place] for [facility], [place]

## 2020-12-01 LAB — PROLACTIN: Prolactin: 13.4 ng/mL (ref 4.8–23.3)

## 2020-12-01 LAB — FOLLICLE STIMULATING HORMONE: FSH: 2.9 m[IU]/mL

## 2020-12-01 LAB — BETA HCG QUANT (REF LAB): hCG Quant: <1 m[IU]/mL

## 2020-12-01 LAB — TSH: TSH: 0.42 u[IU]/mL — ABNORMAL LOW (ref 0.450–4.500)

## 2020-12-02 NOTE — Addendum Note (Signed)
Addended by: Shelly Coss on: 12/02/2020 10:10 AM   Modules accepted: Orders

## 2020-12-03 DIAGNOSIS — Z9009 Acquired absence of other part of head and neck: Secondary | ICD-10-CM

## 2020-12-03 LAB — SPECIMEN STATUS REPORT

## 2020-12-03 LAB — T4, FREE: Free T4: 1.23 ng/dL (ref 0.82–1.77)

## 2020-12-14 ENCOUNTER — Encounter: Payer: Self-pay | Admitting: Family Medicine

## 2020-12-14 ENCOUNTER — Other Ambulatory Visit: Payer: Self-pay

## 2020-12-14 ENCOUNTER — Ambulatory Visit (INDEPENDENT_AMBULATORY_CARE_PROVIDER_SITE_OTHER): Payer: Managed Care, Other (non HMO) | Admitting: Family Medicine

## 2020-12-14 VITALS — BP 115/81 | HR 81 | Ht 62.0 in | Wt 192.7 lb

## 2020-12-14 DIAGNOSIS — N979 Female infertility, unspecified: Secondary | ICD-10-CM

## 2020-12-14 DIAGNOSIS — Z23 Encounter for immunization: Secondary | ICD-10-CM

## 2020-12-14 DIAGNOSIS — N882 Stricture and stenosis of cervix uteri: Secondary | ICD-10-CM | POA: Diagnosis not present

## 2020-12-15 ENCOUNTER — Encounter: Payer: Self-pay | Admitting: Family Medicine

## 2020-12-15 NOTE — Progress Notes (Signed)
Subjective:    Patient ID: Kathryn Santos is a 45 y.o. female presenting with GYN Discussion  on 12/14/2020  HPI: Patient returns today for follow up.  She has a history of endometrial ablation with tubal ligation and subsequent tubal reversal, with normal HSG.  She had ongoing menstrual cycles with multiple early miscarriages and was sent to [facility] for potential resection of scar tissue in her uterus.  She did have this is done in June and was placed on estrogen as well as having a catheter inside her uterus for approximately 2 weeks to avoid subsequent reforming of scar tissue.  She skipped a period in June and July but then got a period in September and October.  The cycles, once they returned, were much heavier than what she had previously been experiencing.  She had had a couple of times where it felt like she might be having symptoms of hematometra and significant pain when she missed her cycles in June and July but that has not reoccurred.  She was initially referred for potential hysteroscopy if there was significant scar tissue that was blocking her menses from coming out.  Review of Systems  Constitutional:  Negative for chills and fever.  Respiratory:  Negative for shortness of breath.   Cardiovascular:  Negative for chest pain.  Gastrointestinal:  Negative for abdominal pain, nausea and vomiting.  Genitourinary:  Negative for dysuria.  Skin:  Negative for rash.     Objective:    BP 115/81   Pulse 81   Ht 5\' 2"  (1.575 m)   Wt 192 lb 11.2 oz (87.4 kg)   LMP 12/04/2020 (Exact Date)   BMI 35.25 kg/m  Physical Exam Constitutional:      General: She is not in acute distress.    Appearance: She is well-developed.  HENT:     Head: Normocephalic and atraumatic.  Eyes:     General: No scleral icterus. Cardiovascular:     Rate and Rhythm: Normal rate.  Pulmonary:     Effort: Pulmonary effort is normal.  Abdominal:     Palpations: Abdomen is soft.  Musculoskeletal:     Cervical  back: Neck supple.  Skin:    General: Skin is warm and dry.  Neurological:     Mental Status: She is alert and oriented to person, place, and time.        Assessment & Plan:   Problem List Items Addressed This Visit       Unprioritized   Cervical os stenosis    Likely resolved      Infertility, female    Given that her cycles are heavier and back to normal with allow her just to continue to try to get pregnant.  She has a Plan B if she does not achieve pregnancy by age 51 with possible surrogacy as well as donor eggs.  Given that she is having cycles and not having any further pain do not think hysteroscopy would be beneficial for this patient.  She is in agreement and declines any further surgeries at this time.      Other Visit Diagnoses     Need for influenza vaccination    -  Primary   Relevant Orders   Flu Vaccine QUAD 40mo+IM (Fluarix, Fluzone & Alfiuria Quad PF) (Completed)       Total time in review of prior notes, history taking including a detailed history of what went on post surgery with REI, pathology, labs, history taking, review with patient,  exam, note writing, discussion of options, plan for next steps, alternatives and risks of treatment: 29 minutes.  Return if symptoms worsen or fail to improve.  Donnamae Jude 12/15/2020 1:09 PM

## 2020-12-15 NOTE — Assessment & Plan Note (Signed)
Given that her cycles are heavier and back to normal with allow her just to continue to try to get pregnant.  She has a Plan B if she does not achieve pregnancy by age 45 with possible surrogacy as well as donor eggs.  Given that she is having cycles and not having any further pain do not think hysteroscopy would be beneficial for this patient.  She is in agreement and declines any further surgeries at this time.

## 2020-12-15 NOTE — Assessment & Plan Note (Signed)
Likely resolved

## 2021-01-03 ENCOUNTER — Other Ambulatory Visit: Payer: Self-pay | Admitting: Gastroenterology

## 2021-01-03 DIAGNOSIS — R140 Abdominal distension (gaseous): Secondary | ICD-10-CM

## 2021-01-03 DIAGNOSIS — Z800 Family history of malignant neoplasm of digestive organs: Secondary | ICD-10-CM

## 2021-01-03 DIAGNOSIS — R194 Change in bowel habit: Secondary | ICD-10-CM

## 2021-01-03 DIAGNOSIS — R110 Nausea: Secondary | ICD-10-CM

## 2021-01-20 ENCOUNTER — Ambulatory Visit
Admission: RE | Admit: 2021-01-20 | Discharge: 2021-01-20 | Disposition: A | Discharge status: Home / Self Care | Payer: Managed Care, Other (non HMO) | Source: Ambulatory Visit | Attending: Family Medicine | Admitting: Family Medicine

## 2021-01-20 DIAGNOSIS — Z1231 Encounter for screening mammogram for malignant neoplasm of breast: Secondary | ICD-10-CM

## 2021-03-23 ENCOUNTER — Encounter: Payer: Self-pay | Admitting: Family Medicine

## 2021-04-06 ENCOUNTER — Other Ambulatory Visit: Payer: Self-pay | Admitting: Gastroenterology

## 2021-04-06 DIAGNOSIS — R110 Nausea: Secondary | ICD-10-CM

## 2021-04-06 DIAGNOSIS — R140 Abdominal distension (gaseous): Secondary | ICD-10-CM

## 2021-04-06 DIAGNOSIS — R194 Change in bowel habit: Secondary | ICD-10-CM

## 2021-04-06 DIAGNOSIS — Z800 Family history of malignant neoplasm of digestive organs: Secondary | ICD-10-CM

## 2021-04-10 ENCOUNTER — Encounter: Payer: Self-pay | Admitting: Gastroenterology

## 2021-04-11 ENCOUNTER — Encounter: Payer: Self-pay | Admitting: Gastroenterology

## 2021-05-03 ENCOUNTER — Other Ambulatory Visit: Payer: Self-pay | Admitting: Family Medicine

## 2021-05-03 DIAGNOSIS — Z8639 Personal history of other endocrine, nutritional and metabolic disease: Secondary | ICD-10-CM

## 2021-05-03 DIAGNOSIS — R5383 Other fatigue: Secondary | ICD-10-CM

## 2021-05-12 ENCOUNTER — Ambulatory Visit
Admission: RE | Admit: 2021-05-12 | Discharge: 2021-05-12 | Disposition: A | Discharge status: Home / Self Care | Payer: Managed Care, Other (non HMO) | Source: Ambulatory Visit | Attending: Family Medicine | Admitting: Family Medicine

## 2021-05-12 DIAGNOSIS — Z8639 Personal history of other endocrine, nutritional and metabolic disease: Secondary | ICD-10-CM

## 2021-05-12 DIAGNOSIS — R5383 Other fatigue: Secondary | ICD-10-CM

## 2021-09-06 NOTE — Therapy (Addendum)
OUTPATIENT PHYSICAL THERAPY EVALUATION /DISCHARGE   Patient Name: Kathryn Santos MRN: [id number] DOB:05/29/1975, 46 y.o., female Today's Date: 09/07/2021   PT End of Session - 09/07/21 0805     Visit Number 1    Number of Visits 16    Date for PT Re-Evaluation 11/02/21    Authorization Type [employer] managed    Authorization Time Period [employer]  $30 COPAY, 30 VISITS PER YEAR, AFTER DED IS MET COVERED AT 100%, DED NOT MET $45.0 USED, OOP MAX $2500 NOT MET/REF#[id number]    Progress Note Due on Visit 10    PT Start Time 0805    PT Stop Time 0848    PT Time Calculation (min) 43 min    Activity Tolerance Patient tolerated treatment well    Behavior During Therapy WFL for tasks assessed/performed             Past Medical History:  Diagnosis Date   Family history of breast cancer    Family history of cervical cancer    Family history of colon cancer    Family history of lung cancer    Thyroid disease    TIA (transient ischemic attack) 2013   Vaginal Pap smear, abnormal    Past Surgical History:  Procedure Laterality Date   CHOLECYSTECTOMY     ENDOMETRIAL ABLATION  2015   FLUOROSCOPIC TUBAL [place]     THYROIDECTOMY  02/2007   TUBAL LIGATION     Patient Active Problem List   Diagnosis Date Noted   Amenorrhea 11/30/2020   Encounter for screening mammogram for malignant neoplasm of breast 11/30/2020   Genetic testing 08/19/2020   Family history of colon cancer 08/08/2020   Family history of lung cancer 08/08/2020   Family history of breast cancer 08/08/2020   Family history of cervical cancer 08/08/2020   PTSD (post-traumatic stress disorder) 07/08/2020   Generalized anxiety disorder 07/08/2020   History of cholestasis during pregnancy 03/21/2020   History of gestational diabetes mellitus 03/16/2020   History of pre-eclampsia 03/16/2020   Infertility, female 02/02/2020   Cervical os stenosis 07/02/2017   Class 1 obesity due to excess calories  without serious comorbidity with body mass index (BMI) of 33.0 to 33.9 in adult 06/26/2017   History of cholecystectomy 06/26/2017   Postoperative hypothyroidism 06/26/2017    PCP: Drue Flirt, MD  REFERRING PROVIDER: Drue Flirt, MD  REFERRING DIAG: M54.41, M54.42, G89.29 - Chronic Midline low back pain with bilateral sciatica  Rationale for Evaluation and Treatment Rehabilitation  THERAPY DIAG:  Other low back pain  Pain in left leg  Abnormal posture  Muscle weakness (generalized)  ONSET DATE: 07/17/21 office visit for LBP exacerbation  SUBJECTIVE:  SUBJECTIVE STATEMENT: Back pain started over 3 months ago with occasional tingling/shooting pain and discomfort in the lower back on left side to L hip, buttocks, back of thigh, and knee. She continued to stretch and walk but didn't do much to address pain, then had an exacerbation of pain prior to 07/17/21 office visit of excruciating pain with bil. sx, couldn't move or walk. Ibuprofen helped a little to fall asleep, but she woke up in pain, and she tried flexeril for 2 days but she felt very groggy on it. She feels better now with occasional bouts of low back pain and left sided leg symptoms. She noted numbness and tingling in her butt, relieved by shifting in seat.  PERTINENT HISTORY:  Anxiety, cervical stenosis  PAIN:  Are you having pain? Yes: NPRS scale: 0/10, highest is 4-5/10 Pain location: lower back, left hip Pain description: burning, achy Aggravating factors: sitting for too long, walking past 30 min Relieving factors: walking breaks, ice, heat, pain meds, massage  PRECAUTIONS: None  WEIGHT BEARING RESTRICTIONS No  FALLS:  Has patient fallen in last 6 months? 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, sitting at desk  PLOF: Independent  PATIENT GOALS: walking with family, prevent recurrence of excruciating pain, reducing need for pain medication   OBJECTIVE:   PATIENT SURVEYS:  09/07/21 FOTO  60, target 67  SCREENING FOR RED FLAGS: 09/07/21 Bowel or bladder incontinence: No Spinal tumors: No Cauda equina syndrome: No some tingling to butt, none in inner thigh/saddle region Compression fracture: No Abdominal aneurysm: No  COGNITION: 09/07/21 Overall cognitive status: Within functional limits for tasks assessed     SENSATION: 09/07/21 Not tested  POSTURE:  09/07/21 rounded shoulders and forward head  LUMBAR ROM:   ROM  AROM  09/07/21  Flexion WNL  Extension 75%  Right lateral flexion 50% Increased pain intensity the most  Left lateral flexion 75% Increased pain intensity  Right rotation   Left rotation    (Blank rows = not tested)  LOWER EXTREMITY ROM:     ROM  Right 09/07/21 Left 09/07/21  Hip flexion    Hip extension    Hip abduction    Hip adduction    Hip internal rotation    Hip external rotation    Knee flexion    Knee extension    Ankle dorsiflexion    Ankle plantarflexion    Ankle inversion    Ankle eversion     (Blank rows = not tested)  LOWER EXTREMITY MMT:    MMT Right 09/07/21 Left 09/07/21  Hip flexion 4/5 4/5  Hip extension    Hip abduction    Hip adduction    Hip internal rotation    Hip external rotation    Knee flexion 5/5 4+/5  Knee extension 4+/5 4+/5  Ankle dorsiflexion 5/5 5/5  Ankle plantarflexion    Ankle inversion    Ankle eversion     (Blank rows = not tested)  LUMBAR SPECIAL TESTS:  09/07/21 Slump test: Positive Lt side shooting pain down back of leg relieved by  head movement  GAIT: 09/07/21 Distance walked: 100' Assistive device utilized: None Level of assistance: Complete Independence Comments: bil. hip ER and reduced bil.  hip extension in terminal stance  TODAY'S TREATMENT  09/07/21 Therex: PT reviewed HEP [email or web address] below, pt performed x3 trial reps for comprehension and verbalized understanding with handout   PATIENT EDUCATION:  Education details: [email or web address].  HEP/POC education Person educated: Patient Education method: Explanation, Demonstration, Verbal cues, and Handouts Education comprehension: verbalized understanding, returned demonstration, and verbal cues required   HOME EXERCISE PROGRAM: Access Code: [email or web address] URL: [email or web address] Date: 09/07/2021 Prepared by: Scot Jun  Exercises - Supine Lower Trunk Rotation  - 2-3 x daily - 7 x weekly - 1 sets - 3-5 reps - 15 hold - Supine 90/90 Sciatic Nerve Glide with Knee Flexion/Extension  - 2-3 x daily - 7 x weekly - 1-2 sets - 10 reps - Supine Piriformis Stretch with Foot on Ground (Mirrored)  - 2-3 x daily - 7 x weekly - 1 sets - 3-5 reps - 15-30 hold - Standing Lumbar Extension with Counter  - 3-5 x daily - 7 x weekly - 1 sets - 5-10 reps  ASSESSMENT:  CLINICAL IMPRESSION: Patient is a 46 y.o. female who was seen today for physical therapy evaluation and treatment for chronic low back pain with left sided radiating symptoms. She presents with aching and burning pain with some numbness/tingling and pain increases with Rt and Lt side bending, with some restricted but pain-free lumbar extension. She will benefit from skilled PT to address functional limitations in order to remain active with her family and perform duties of her occupation.   OBJECTIVE IMPAIRMENTS decreased activity tolerance, decreased endurance, decreased mobility, difficulty walking, decreased ROM, decreased strength, postural dysfunction, and pain.   ACTIVITY LIMITATIONS carrying, lifting,  sitting, standing, sleeping, stairs, locomotion level, and caring for others  PARTICIPATION LIMITATIONS: cleaning, laundry, community activity, occupation, and walking for exercise  PERSONAL FACTORS Profession and 1-2 comorbidities: see PMH  are also affecting patient's functional outcome.   REHAB POTENTIAL: Good  CLINICAL DECISION MAKING: Stable/uncomplicated  EVALUATION COMPLEXITY: Low   GOALS: Goals reviewed with patient? Yes  SHORT TERM GOALS: Target date: 09/28/21 (3 weeks)  Patient will be independent with HEP to continue progressing outside of clinic. Baseline: see objective data Goal status: INITIAL   LONG TERM GOALS: Target date: 11/02/21 (8 weeks)  Patient will score FOTO >/= 71 to reflect improved self-perceived functional ability Baseline: see objective data Goal status: INITIAL  2.  Patient will be independent with a HEP to maintain and progress functional gains from skilled PT. Baseline: see objective data Goal status: INITIAL  3.  Patient will report </= 2/10 pain with sitting, standing, and walking activities. Baseline: see objective data Goal status: INITIAL  4.  Patient verbalizes and demonstrates ability to perform duties required for her occupation, including prolonged sitting at desk and lifting and carrying small children. Baseline: see objective data Goal status: INITIAL  5.  Patient verbalizes and demonstrates ability to walk for exercise without increases in low back pain and with maintained walking speed. Baseline: see objective data Goal status: INITIAL  6.  Patient presents with lumbar motions WNL without increases in pain intensity or peripheralization to allow for normal functional movement ability. Baseline: see objective data Goal status: INITIAL   PLAN: PT FREQUENCY: 1-2x/week  PT DURATION: 8 weeks  PLANNED INTERVENTIONS: Therapeutic exercises, Therapeutic activity, Neuromuscular re-education, Balance training, Gait training,  Patient/Family education, Self Care, Joint mobilization, Stair training, Dry Needling, Spinal mobilization, Cryotherapy, Moist heat, Ultrasound, Ionotophoresis 6m/ml Dexamethasone, Manual therapy, Re-evaluation, and physical performance tests .  PLAN FOR NEXT SESSION: review  HEP, nustep, manual for sx relief as indicated   Jana Hakim, Student-PT 09/07/2021, 9:39 AM  This entire session of physical therapy was performed under the direct supervision of PT signing evaluation /treatment. PT reviewed note and agrees.  Scot Jun, PT, DPT, OCS, ATC 09/07/21, 10:15 AM    PHYSICAL THERAPY DISCHARGE SUMMARY  Visits from Start of Care: 1  Current functional level related to goals / functional outcomes: See note   Remaining deficits: See note   Education / Equipment: HEP  Patient goals were not met. Patient is being discharged due to not returning since the last visit.   Scot Jun, PT, DPT, OCS, ATC 10/13/21  8:27 AM

## 2021-09-07 ENCOUNTER — Other Ambulatory Visit: Payer: Self-pay

## 2021-09-07 ENCOUNTER — Encounter: Payer: Self-pay | Admitting: Rehabilitative and Restorative Service Providers"

## 2021-09-07 ENCOUNTER — Ambulatory Visit: Payer: Managed Care, Other (non HMO) | Admitting: Rehabilitative and Restorative Service Providers"

## 2021-09-07 DIAGNOSIS — R293 Abnormal posture: Secondary | ICD-10-CM | POA: Diagnosis not present

## 2021-09-07 DIAGNOSIS — M79605 Pain in left leg: Secondary | ICD-10-CM

## 2021-09-07 DIAGNOSIS — M6281 Muscle weakness (generalized): Secondary | ICD-10-CM | POA: Diagnosis not present

## 2021-09-07 DIAGNOSIS — M5459 Other low back pain: Secondary | ICD-10-CM

## 2021-09-19 ENCOUNTER — Telehealth: Payer: Self-pay | Admitting: Rehabilitative and Restorative Service Providers"

## 2021-09-19 ENCOUNTER — Encounter: Payer: Managed Care, Other (non HMO) | Admitting: Rehabilitative and Restorative Service Providers"

## 2021-09-19 NOTE — Telephone Encounter (Signed)
Called patient after 15 mins no show for appointment today.  Left message and reminder of next appointment time, as well as call back number.   Scot Jun, PT, DPT, OCS, ATC 09/19/21  9:09 AM

## 2021-09-29 ENCOUNTER — Telehealth: Payer: Self-pay | Admitting: Physical Therapy

## 2021-09-29 ENCOUNTER — Encounter: Payer: Managed Care, Other (non HMO) | Admitting: Physical Therapy

## 2021-09-29 ENCOUNTER — Encounter: Payer: Self-pay | Admitting: Physical Therapy

## 2021-09-29 NOTE — Telephone Encounter (Signed)
No-show for this morning's appointment. Tried to call but voicemail was full, will try to send message via mychart as well. We will drop to 1 visit at a time per [facility] policy as this is her second no show in a row.  Ann Lions PT, DPT, PN2   Supplemental Physical Therapist [place]

## 2021-10-10 ENCOUNTER — Other Ambulatory Visit: Payer: Self-pay | Admitting: Gastroenterology

## 2021-10-10 DIAGNOSIS — R194 Change in bowel habit: Secondary | ICD-10-CM

## 2021-10-10 DIAGNOSIS — R140 Abdominal distension (gaseous): Secondary | ICD-10-CM

## 2021-10-10 DIAGNOSIS — Z800 Family history of malignant neoplasm of digestive organs: Secondary | ICD-10-CM

## 2021-10-10 DIAGNOSIS — R110 Nausea: Secondary | ICD-10-CM

## 2021-10-13 ENCOUNTER — Encounter: Payer: Managed Care, Other (non HMO) | Admitting: Rehabilitative and Restorative Service Providers"

## 2021-10-13 ENCOUNTER — Telehealth: Payer: Self-pay | Admitting: Rehabilitative and Restorative Service Providers"

## 2021-10-13 NOTE — Telephone Encounter (Signed)
Called patient after 15 mins no show for appointment today.  Left message about missed visit and no other appointments scheduled at this time. Will discharge due to inactivity > 30 days and no shows.  Scot Jun, PT, DPT, OCS, ATC 10/13/21  8:26 AM

## 2021-10-17 ENCOUNTER — Encounter: Payer: Managed Care, Other (non HMO) | Admitting: Rehabilitative and Restorative Service Providers"

## 2022-07-16 ENCOUNTER — Other Ambulatory Visit: Payer: BC Managed Care – PPO

## 2022-07-16 ENCOUNTER — Ambulatory Visit (INDEPENDENT_AMBULATORY_CARE_PROVIDER_SITE_OTHER): Payer: BC Managed Care – PPO | Admitting: General Practice

## 2022-07-16 DIAGNOSIS — Z9889 Other specified postprocedural states: Secondary | ICD-10-CM

## 2022-07-16 DIAGNOSIS — Z3201 Encounter for pregnancy test, result positive: Secondary | ICD-10-CM

## 2022-07-16 LAB — POCT PREGNANCY, URINE: Preg Test, Ur: POSITIVE — AB

## 2022-07-16 NOTE — Progress Notes (Signed)
Patient came by office and dropped off urine sample for UPT. UPT +.  Called patient and informed her of + UPT.  Patient reports first positive home test on Friday. LMP 06/16/22 EDD 03/23/23 [redacted]w[redacted]d. Patient has history of tubal reversal. Reviewed allergies/meds with patient. Per Dr Vergie Living, patient needs bhcg 48 hours apart x2 given history then ultrasound following based off results. Discussed with patient and arranged for her to have labs at [facility] office due to her living in [place]. Discussed with patient we will schedule her ultrasound after the lab results come back. Patient verbalized understanding and states that she is going on a cruise at the end of the month and wants to know what she could do for sea sickness. Reviewed that she could try sea bands, ginger tablets, meclizine or dramamine. Patient verbalized understanding and had no other questions at this time.   Chase Caller RN BSN 07/16/22

## 2022-07-17 ENCOUNTER — Encounter: Payer: Self-pay | Admitting: Obstetrics and Gynecology

## 2022-07-17 LAB — BETA HCG QUANT (REF LAB): hCG Quant: 164 m[IU]/mL

## 2022-07-18 ENCOUNTER — Other Ambulatory Visit: Payer: BC Managed Care – PPO

## 2022-07-18 DIAGNOSIS — Z3201 Encounter for pregnancy test, result positive: Secondary | ICD-10-CM

## 2022-07-18 DIAGNOSIS — Z9889 Other specified postprocedural states: Secondary | ICD-10-CM

## 2022-07-19 LAB — BETA HCG QUANT (REF LAB): hCG Quant: 415 m[IU]/mL

## 2022-08-01 ENCOUNTER — Other Ambulatory Visit (INDEPENDENT_AMBULATORY_CARE_PROVIDER_SITE_OTHER): Payer: BC Managed Care – PPO

## 2022-08-01 ENCOUNTER — Ambulatory Visit (INDEPENDENT_AMBULATORY_CARE_PROVIDER_SITE_OTHER): Payer: BC Managed Care – PPO | Admitting: *Deleted

## 2022-08-01 VITALS — BP 138/83 | HR 91

## 2022-08-01 DIAGNOSIS — Z3A01 Less than 8 weeks gestation of pregnancy: Secondary | ICD-10-CM | POA: Diagnosis not present

## 2022-08-01 DIAGNOSIS — O3680X0 Pregnancy with inconclusive fetal viability, not applicable or unspecified: Secondary | ICD-10-CM | POA: Diagnosis not present

## 2022-08-01 NOTE — Progress Notes (Signed)
New OB Scan  I explained I am completing New OB Scan today, due to rick of ectopic as pt had a tubal reversal.  LMP of 06/16/22 and had appropriate rise in her HCG's. . Pt is G10/P6. I reviewed her allergies, medications, Medical/Surgical/OB history, and appropriate screenings.   Patient Active Problem List   Diagnosis Date Noted   Amenorrhea 11/30/2020   Encounter for screening mammogram for malignant neoplasm of breast 11/30/2020   Genetic testing 08/19/2020   Family history of colon cancer 08/08/2020   Family history of lung cancer 08/08/2020   Family history of breast cancer 08/08/2020   Family history of cervical cancer 08/08/2020   PTSD (post-traumatic stress disorder) 07/08/2020   Generalized anxiety disorder 07/08/2020   History of cholestasis during pregnancy 03/21/2020   History of gestational diabetes mellitus 03/16/2020   History of pre-eclampsia 03/16/2020   Infertility, female 02/02/2020   Cervical os stenosis 07/02/2017   Class 1 obesity due to excess calories without serious comorbidity with body mass index (BMI) of 33.0 to 33.9 in adult 06/26/2017   History of reversal of tubal ligation 06/26/2017   Postoperative hypothyroidism 06/26/2017    Concerns addressed today   Patient informed that the ultrasound is considered a limited obstetric ultrasound and is not intended to be a complete ultrasound exam.  Patient also informed that the ultrasound is not being completed with the intent of assessing for fetal or placental anomalies or any pelvic abnormalities. Explained that the purpose of today's ultrasound is to assess for dating and fetal heart rate.  Patient acknowledges the purpose of the exam and the limitations of the study.     Gestational sac and yolk sac seen today, no fetal pole. Will have patient come back on 08/14/22 for repeat scan.    Scheryl Marten, RN 08/01/2022  10:47 AM

## 2022-08-13 DIAGNOSIS — O039 Complete or unspecified spontaneous abortion without complication: Secondary | ICD-10-CM

## 2022-08-13 HISTORY — DX: Complete or unspecified spontaneous abortion without complication: O03.9

## 2022-08-14 ENCOUNTER — Other Ambulatory Visit (INDEPENDENT_AMBULATORY_CARE_PROVIDER_SITE_OTHER): Payer: BC Managed Care – PPO

## 2022-08-14 ENCOUNTER — Ambulatory Visit (INDEPENDENT_AMBULATORY_CARE_PROVIDER_SITE_OTHER): Payer: BC Managed Care – PPO | Admitting: Obstetrics and Gynecology

## 2022-08-14 VITALS — BP 128/82 | HR 70 | Wt 193.0 lb

## 2022-08-14 DIAGNOSIS — O021 Missed abortion: Secondary | ICD-10-CM | POA: Diagnosis not present

## 2022-08-14 DIAGNOSIS — Z3A10 10 weeks gestation of pregnancy: Secondary | ICD-10-CM

## 2022-08-14 DIAGNOSIS — O3680X0 Pregnancy with inconclusive fetal viability, not applicable or unspecified: Secondary | ICD-10-CM

## 2022-08-14 DIAGNOSIS — O09521 Supervision of elderly multigravida, first trimester: Secondary | ICD-10-CM | POA: Diagnosis not present

## 2022-08-14 DIAGNOSIS — O0941 Supervision of pregnancy with grand multiparity, first trimester: Secondary | ICD-10-CM

## 2022-08-14 NOTE — Progress Notes (Signed)
Patient informed that the ultrasound is considered a limited obstetric ultrasound and is not intended to be a complete ultrasound exam.  Patient also informed that the ultrasound is not being completed with the intent of assessing for fetal or placental anomalies or any pelvic abnormalities. Explained that the purpose of today's ultrasound is to assess for fetal heart rate.  Patient acknowledges the purpose of the exam and the limitations of the study.         

## 2022-08-14 NOTE — Progress Notes (Signed)
Obstetrics and Gynecology Visit Return Patient Evaluation  Appointment Date: 08/14/2022  Primary Care Provider: [facility]: [facility] for [facility]  Chief Complaint: add on visit for early pregnancy failure on ultrasound this morning  History of Present Illness:  Kathryn Santos is a 47 y.o. P3 with above CC. Patient here for viability scan after 6/19 u/s showed IUP with YS. Occasional slight cramping but no bleeding or spotting. Ultrasound done by RN and no fetal pole noted.   Review of Systems: as noted in the History of Present Illness.  Patient Active Problem List   Diagnosis Date Noted   Amenorrhea 11/30/2020   Encounter for screening mammogram for malignant neoplasm of breast 11/30/2020   Genetic testing 08/19/2020   Family history of colon cancer 08/08/2020   Family history of lung cancer 08/08/2020   Family history of breast cancer 08/08/2020   Family history of cervical cancer 08/08/2020   PTSD (post-traumatic stress disorder) 07/08/2020   Generalized anxiety disorder 07/08/2020   History of cholestasis during pregnancy 03/21/2020   History of gestational diabetes mellitus 03/16/2020   History of pre-eclampsia 03/16/2020   Infertility, female 02/02/2020   Cervical os stenosis 07/02/2017   Class 1 obesity due to excess calories without serious comorbidity with body mass index (BMI) of 33.0 to 33.9 in adult 06/26/2017   History of reversal of tubal ligation 06/26/2017   Postoperative hypothyroidism 06/26/2017   Medications:  Kathryn Santos had no medications administered during this visit. Current Outpatient Medications  Medication Sig Dispense Refill   folic acid (FOLVITE) 1 MG tablet Take 1 mg by mouth daily.     prenatal vitamin w/FE, FA (PRENATAL 1 + 1) 27-1 MG TABS tablet Take 1 tablet by mouth daily at 12 noon.     vitamin E 200 UNIT capsule Take 200 Units by mouth daily.     No current facility-administered medications for  this visit.    Allergies: is allergic to dilaudid [hydromorphone], zithromax [azithromycin], and mushroom extract complex.  Physical Exam:  BP 128/82   Pulse 70   Wt 193 lb (87.5 kg)   LMP 06/16/2022   BMI 35.30 kg/m  Body mass index is 35.3 kg/m. General appearance: Well nourished, well developed female in no acute distress.  Abdomen: diffusely non tender to palpation, non distended, and no masses, hernias Neuro/Psych:  Normal mood and affect.    TVUS done and GS with ?collapsing YS but no fetal pole noted   Assessment: Missed AB  Plan:  1. Encounter to determine fetal viability of pregnancy, single or unspecified fetus - US OB Limited; Future  2. Missed abortion Options (expectant mangement, medical, surgical) d/w her and she elected for expectant management. Pt declines abo as she's sure she's Rh positive. ED precautions given.   3. Multigravida of advanced maternal age in first trimester   Return in about 2 weeks (around 08/28/2022) for 2-3w, in person, md visit.  Future Appointments  Date Time Provider Department Center  09/04/2022  1:50 PM Anyanwu, Jethro Bastos, MD [facility]    Cornelia Copa MD Attending Center for [facility])

## 2022-08-28 ENCOUNTER — Other Ambulatory Visit: Payer: Self-pay | Admitting: Obstetrics and Gynecology

## 2022-08-28 ENCOUNTER — Other Ambulatory Visit: Payer: Self-pay

## 2022-08-28 ENCOUNTER — Encounter (HOSPITAL_BASED_OUTPATIENT_CLINIC_OR_DEPARTMENT_OTHER): Payer: Self-pay | Admitting: Obstetrics and Gynecology

## 2022-08-28 NOTE — Progress Notes (Signed)
Spoke w/ via phone for pre-op interview---pt Lab needs dos----  none per anesthesia surgery orders pending             Lab results------none COVID test -----patient states asymptomatic no test needed Arrive at -------945 am 09-03-2022 NPO after MN NO Solid Food.  Clear liquids from MN until---845 am Med rec completed Medications to take morning of surgery -----none Diabetic medication -----n/a Patient instructed no nail polish to be worn day of surgery Patient instructed to bring photo id and insurance card day of surgery Patient aware to have Driver (ride ) / caregiver   husband Kathryn Santos  for 24 hours after surgery  Patient Special Instructions -----none Pre-Op special Instructions -----none Patient verbalized understanding of instructions that were given at this phone interview. Patient denies shortness of breath, chest pain, fever, cough at this phone interview.

## 2022-09-03 ENCOUNTER — Encounter (HOSPITAL_BASED_OUTPATIENT_CLINIC_OR_DEPARTMENT_OTHER): Payer: Self-pay | Admitting: Obstetrics and Gynecology

## 2022-09-03 ENCOUNTER — Other Ambulatory Visit: Payer: Self-pay

## 2022-09-03 ENCOUNTER — Ambulatory Visit (HOSPITAL_BASED_OUTPATIENT_CLINIC_OR_DEPARTMENT_OTHER): Payer: BC Managed Care – PPO | Admitting: Anesthesiology

## 2022-09-03 ENCOUNTER — Encounter (HOSPITAL_BASED_OUTPATIENT_CLINIC_OR_DEPARTMENT_OTHER)
Admission: RE | Disposition: A | Discharge status: Home / Self Care | Payer: Self-pay | Source: Home / Self Care | Attending: Obstetrics and Gynecology

## 2022-09-03 ENCOUNTER — Ambulatory Visit (HOSPITAL_BASED_OUTPATIENT_CLINIC_OR_DEPARTMENT_OTHER)
Admission: RE | Admit: 2022-09-03 | Discharge: 2022-09-03 | Disposition: A | Discharge status: Home / Self Care | Payer: BC Managed Care – PPO | Attending: Obstetrics and Gynecology | Admitting: Obstetrics and Gynecology

## 2022-09-03 DIAGNOSIS — Z8673 Personal history of transient ischemic attack (TIA), and cerebral infarction without residual deficits: Secondary | ICD-10-CM | POA: Insufficient documentation

## 2022-09-03 DIAGNOSIS — O021 Missed abortion: Secondary | ICD-10-CM | POA: Insufficient documentation

## 2022-09-03 DIAGNOSIS — Z01818 Encounter for other preprocedural examination: Secondary | ICD-10-CM

## 2022-09-03 HISTORY — PX: DILATION AND EVACUATION: SHX1459

## 2022-09-03 HISTORY — DX: Presence of spectacles and contact lenses: Z97.3

## 2022-09-03 LAB — TYPE AND SCREEN
ABO/RH(D): O POS
Antibody Screen: NEGATIVE

## 2022-09-03 SURGERY — DILATION AND EVACUATION, UTERUS
Anesthesia: General | Site: Uterus

## 2022-09-03 MED ORDER — PROPOFOL 10 MG/ML IV BOLUS
INTRAVENOUS | Status: DC | PRN
Start: 2022-09-03 — End: 2022-09-03
  Administered 2022-09-03 (×2): 20 mg via INTRAVENOUS
  Administered 2022-09-03: 150 mg via INTRAVENOUS

## 2022-09-03 MED ORDER — GLYCOPYRROLATE PF 0.2 MG/ML IJ SOSY
PREFILLED_SYRINGE | INTRAMUSCULAR | Status: DC | PRN
  Administered 2022-09-03: .1 mg via INTRAVENOUS

## 2022-09-03 MED ORDER — KETOROLAC TROMETHAMINE 30 MG/ML IJ SOLN
INTRAMUSCULAR | Status: DC | PRN
  Administered 2022-09-03: 30 mg via INTRAVENOUS

## 2022-09-03 MED ORDER — GLYCOPYRROLATE PF 0.2 MG/ML IJ SOSY
PREFILLED_SYRINGE | INTRAMUSCULAR | Status: AC
  Filled 2022-09-03: qty 1

## 2022-09-03 MED ORDER — DEXAMETHASONE SODIUM PHOSPHATE 10 MG/ML IJ SOLN
INTRAMUSCULAR | Status: AC
  Filled 2022-09-03: qty 1

## 2022-09-03 MED ORDER — LACTATED RINGERS IV SOLN
INTRAVENOUS | Status: DC
  Administered 2022-09-03: 1000 mL via INTRAVENOUS

## 2022-09-03 MED ORDER — FENTANYL CITRATE (PF) 100 MCG/2ML IJ SOLN
INTRAMUSCULAR | Status: DC | PRN
  Administered 2022-09-03: 50 ug via INTRAVENOUS

## 2022-09-03 MED ORDER — 0.9 % SODIUM CHLORIDE (POUR BTL) OPTIME
TOPICAL | Status: DC | PRN
  Administered 2022-09-03: 500 mL

## 2022-09-03 MED ORDER — ONDANSETRON HCL 4 MG/2ML IJ SOLN
INTRAMUSCULAR | Status: AC
  Filled 2022-09-03: qty 2

## 2022-09-03 MED ORDER — ONDANSETRON HCL 4 MG/2ML IJ SOLN
INTRAMUSCULAR | Status: DC | PRN
  Administered 2022-09-03: 4 mg via INTRAVENOUS

## 2022-09-03 MED ORDER — IBUPROFEN 600 MG PO TABS: 600.0000 mg | ORAL_TABLET | Freq: Four times a day (QID) | ORAL | 0 refills | Status: AC | PRN

## 2022-09-03 MED ORDER — MIDAZOLAM HCL 2 MG/2ML IJ SOLN
INTRAMUSCULAR | Status: AC
  Filled 2022-09-03: qty 2

## 2022-09-03 MED ORDER — POVIDONE-IODINE 10 % EX SWAB: 2.0000 | Freq: Once | CUTANEOUS | Status: DC

## 2022-09-03 MED ORDER — MIDAZOLAM HCL 5 MG/5ML IJ SOLN
INTRAMUSCULAR | Status: DC | PRN
  Administered 2022-09-03: 2 mg via INTRAVENOUS

## 2022-09-03 MED ORDER — ACETAMINOPHEN 500 MG PO TABS
ORAL_TABLET | ORAL | Status: AC
  Filled 2022-09-03: qty 2

## 2022-09-03 MED ORDER — ACETAMINOPHEN 500 MG PO TABS
1000.0000 mg | ORAL_TABLET | Freq: Once | ORAL | Status: AC
  Administered 2022-09-03: 1000 mg via ORAL

## 2022-09-03 MED ORDER — DEXAMETHASONE SODIUM PHOSPHATE 10 MG/ML IJ SOLN
INTRAMUSCULAR | Status: DC | PRN
  Administered 2022-09-03: 10 mg via INTRAVENOUS

## 2022-09-03 MED ORDER — PROPOFOL 10 MG/ML IV BOLUS
INTRAVENOUS | Status: AC
  Filled 2022-09-03: qty 20

## 2022-09-03 MED ORDER — LIDOCAINE HCL 2 % IJ SOLN
INTRAMUSCULAR | Status: DC | PRN
  Administered 2022-09-03: 10 mL

## 2022-09-03 MED ORDER — PROPOFOL 500 MG/50ML IV EMUL
INTRAVENOUS | Status: DC | PRN
  Administered 2022-09-03: 200 ug/kg/min via INTRAVENOUS

## 2022-09-03 MED ORDER — LIDOCAINE 2% (20 MG/ML) 5 ML SYRINGE
INTRAMUSCULAR | Status: DC | PRN
  Administered 2022-09-03: 60 mg via INTRAVENOUS

## 2022-09-03 MED ORDER — PROPOFOL 1000 MG/100ML IV EMUL
INTRAVENOUS | Status: AC
  Filled 2022-09-03: qty 100

## 2022-09-03 MED ORDER — FENTANYL CITRATE (PF) 100 MCG/2ML IJ SOLN
INTRAMUSCULAR | Status: AC
  Filled 2022-09-03: qty 2

## 2022-09-03 MED ORDER — KETOROLAC TROMETHAMINE 30 MG/ML IJ SOLN
INTRAMUSCULAR | Status: AC
  Filled 2022-09-03: qty 1

## 2022-09-03 SURGICAL SUPPLY — 19 items
CATH ROBINSON RED A/P 16FR (CATHETERS) ×1 IMPLANT
CLOTH BEACON ORANGE TIMEOUT ST (SAFETY) ×1 IMPLANT
DILATOR CANAL MILEX (MISCELLANEOUS) IMPLANT
GLOVE BIO SURGEON STRL SZ 6.5 (GLOVE) ×1 IMPLANT
GLOVE BIOGEL PI IND STRL 7.0 (GLOVE) ×2 IMPLANT
GOWN STRL REUS W/TWL LRG LVL3 (GOWN DISPOSABLE) ×2 IMPLANT
KIT BERKELEY 1ST TRIMESTER 3/8 (MISCELLANEOUS) ×1 IMPLANT
NS IRRIG 1000ML POUR BTL (IV SOLUTION) ×1 IMPLANT
PACK VAGINAL MINOR WOMEN LF (CUSTOM PROCEDURE TRAY) ×1 IMPLANT
PAD OB MATERNITY 4.3X12.25 (PERSONAL CARE ITEMS) ×1 IMPLANT
PAD PREP 24X48 CUFFED NSTRL (MISCELLANEOUS) ×1 IMPLANT
SET BERKELEY SUCTION TUBING (SUCTIONS) ×1 IMPLANT
SPIKE FLUID TRANSFER (MISCELLANEOUS) ×1 IMPLANT
SYR 20ML LL LF (SYRINGE) ×1 IMPLANT
TOWEL OR 17X24 6PK STRL BLUE (TOWEL DISPOSABLE) ×2 IMPLANT
VACURETTE 10 RIGID CVD (CANNULA) IMPLANT
VACURETTE 7MM CVD STRL WRAP (CANNULA) IMPLANT
VACURETTE 8 RIGID CVD (CANNULA) IMPLANT
VACURETTE 9 RIGID CVD (CANNULA) IMPLANT

## 2022-09-03 NOTE — Anesthesia Procedure Notes (Signed)
Procedure Name: LMA Insertion Date/Time: 09/03/2022 12:02 PM  Performed by: Bishop Limbo, CRNAPre-anesthesia Checklist: Patient identified, Emergency Drugs available, Suction available and Patient being monitored Patient Re-evaluated:Patient Re-evaluated prior to induction Oxygen Delivery Method: Circle System Utilized Preoxygenation: Pre-oxygenation with 100% oxygen Induction Type: IV induction Ventilation: Mask ventilation without difficulty LMA: LMA inserted LMA Size: 4.0 Number of attempts: 1 Airway Equipment and Method: Bite block Placement Confirmation: positive ETCO2 Tube secured with: Tape Dental Injury: Teeth and Oropharynx as per pre-operative assessment

## 2022-09-03 NOTE — Anesthesia Postprocedure Evaluation (Signed)
Anesthesia Post Note  Patient: Kathryn Santos  Procedure(s) Performed: DILATATION AND EVACUATION (Uterus)     Patient location during evaluation: PACU Anesthesia Type: General Level of consciousness: awake and alert Pain management: pain level controlled Vital Signs Assessment: post-procedure vital signs reviewed and stable Respiratory status: spontaneous breathing, nonlabored ventilation and respiratory function stable Cardiovascular status: blood pressure returned to baseline and stable Postop Assessment: no apparent nausea or vomiting Anesthetic complications: no  No notable events documented.  Last Vitals:  Vitals:   09/03/22 1300 09/03/22 1315  BP: (!) 138/94 135/88  Pulse: 83 65  Resp: 16 16  Temp:  36.8 C  SpO2: 100% 99%    Last Pain:  Vitals:   09/03/22 1315  TempSrc:   PainSc: 0-No pain                 Adrieana Fennelly,W. EDMOND

## 2022-09-03 NOTE — Transfer of Care (Signed)
Immediate Anesthesia Transfer of Care Note  Patient: Kathryn Santos  Procedure(s) Performed: DILATATION AND EVACUATION (Uterus)  Patient Location: PACU  Anesthesia Type:General  Level of Consciousness: awake, alert , oriented, and patient cooperative  Airway & Oxygen Therapy: Patient Spontanous Breathing  Post-op Assessment: Report given to RN and Post -op Vital signs reviewed and stable  Post vital signs: Reviewed and stable  Last Vitals:  Vitals Value Taken Time  BP 129/85 09/03/22 1232  Temp    Pulse 86 09/03/22 1234  Resp 24 09/03/22 1234  SpO2 97 % 09/03/22 1234  Vitals shown include unfiled device data.  Last Pain:  Vitals:   09/03/22 1030  TempSrc:   PainSc: 4       Patients Stated Pain Goal: 6 (09/03/22 1030)  Complications: No notable events documented.

## 2022-09-03 NOTE — H&P (Signed)
Kathryn Santos is an 47 y.o. female.G7P6 presenting for dilation and evacuation of missed abortion. She had an [place] at [facility] on 6/19 and confirmed IUP with gestational sac and yolk sac measuring [redacted]w[redacted]d and had a repeat US on 08/14/2022 with gestational sac, but no yolk sac measuring [redacted]w[redacted]d. Concerned for missed abortion. Down trending b-hcg. S/p Cytotec PV 1 week ago. Had intense cramping with medication but no passage of tissue. Limited visibility on bedside US. Counseled on recommendation for suction D&E and procedure discussed at length. Discussed R/B/A. Risk included, but not limited to infection, bleeding, damage to surrounding structures, uterine perforation. All questions answered and informed written and verbal consent obtained.   Pertinent Gynecological History: Contraception: none DES exposure: denies Blood transfusions: none Sexually transmitted diseases: no past history Previous GYN Procedures: ablation, tubal reversal Last mammogram: normal  Last pap: normal OB History: G7, P6   Menstrual History: Menarche age: 41 Patient's last menstrual period was 06/16/2022.    Past Medical History:  Diagnosis Date   Complication of anesthesia 2000   slow to awaken had to spend night   Family history of breast cancer    Family history of cervical cancer    Family history of colon cancer    Family history of lung cancer    Miscarriage 08/2022   Thyroid disease    TIA (transient ischemic attack) 2013   none since, thought to be related to birth control use at the time   Vaginal Pap smear, abnormal    Wears glasses     Past Surgical History:  Procedure Laterality Date   ABDOMINOPLASTY  2010   CHOLECYSTECTOMY     ENDOMETRIAL ABLATION  2015   FLUOROSCOPIC TUBAL RECANNULATUON     GALLBLADDER SURGERY     THYROIDECTOMY  02/2007   TUBAL LIGATION      Family History  Problem Relation Age of Onset   Diabetes Mother    Hypertension Father    Colon cancer Father 30   Cancer Father     Heart disease Father    Cervical cancer Sister    Diabetes Maternal Aunt    Diabetes Maternal Uncle    Hypertension Paternal Uncle    Diabetes Maternal Grandmother    Stroke Maternal Grandmother    Diabetes Maternal Grandfather    Hypertension Maternal Grandfather    Hypertension Paternal Grandfather    Cancer Paternal Grandfather        lung   Heart disease Paternal Grandfather    Breast cancer Other    Breast cancer Cousin        dx 84s   Esophageal cancer Neg Hx    Rectal cancer Neg Hx     Social History:  reports that she has never smoked. She has never used smokeless tobacco. She reports that she does not currently use alcohol. She reports that she does not currently use drugs.  Allergies:  Allergies  Allergen Reactions   Dilaudid [Hydromorphone] Itching and Nausea And Vomiting   Zithromax [Azithromycin] Hives and Other (See Comments)    Stiffness in face   Mushroom Extract Complex Hives    Medications Prior to Admission  Medication Sig Dispense Refill Last Dose   folic acid (FOLVITE) 1 MG tablet Take 1 mg by mouth daily.   09/02/2022   ibuprofen (ADVIL) 600 MG tablet Take 600 mg by mouth every 6 (six) hours as needed. Every 6-8 hrs as needed.   09/01/2022   prenatal vitamin w/FE, FA (PRENATAL 1 +  1) 27-1 MG TABS tablet Take 1 tablet by mouth daily at 12 noon.   09/02/2022   progesterone (PROMETRIUM) 200 MG capsule Take 200 mg by mouth daily.   Past Month   vitamin E 200 UNIT capsule Take 200 Units by mouth daily.   09/02/2022    Review of Systems  All other systems reviewed and are negative.   Blood pressure 138/88, pulse 89, temperature 98.3 F (36.8 C), temperature source Oral, resp. rate 17, height 5\' 2"  (1.575 m), weight 87.9 kg, last menstrual period 06/16/2022, SpO2 100%, unknown if currently breastfeeding. Physical Exam Vitals reviewed.  Constitutional:      Appearance: Normal appearance.  Cardiovascular:     Rate and Rhythm: Normal rate.  Pulmonary:      Effort: Pulmonary effort is normal. No respiratory distress.  Abdominal:     General: There is no distension.     Palpations: Abdomen is soft.     Tenderness: There is no abdominal tenderness.  Skin:    General: Skin is warm and dry.  Neurological:     General: No focal deficit present.     Mental Status: She is alert and oriented to person, place, and time.  Psychiatric:        Mood and Affect: Mood normal.     No results found for this or any previous visit (from the past 24 hour(s)).  No results found.  Assessment/Plan: Missed miscarriage Failed medical management  Plan to proceed with dilation and evacuation of retained products of conception.  Discussed R/B/A. Risk included, but not limited to infection, bleeding, damage to surrounding structures, uterine perforation. All questions answered and informed written and verbal consent obtained.  [name] 09/03/2022, 11:43 AM

## 2022-09-03 NOTE — Op Note (Signed)
09/03/2022  1:22 PM  PATIENT:  Kathryn Santos  47 y.o. female  PRE-OPERATIVE DIAGNOSIS:  Missed miscarriage  POST-OPERATIVE DIAGNOSIS:  Missed miscarriage  PROCEDURE:  Procedure(s): DILATATION AND EVACUATION (N/A)  SURGEON:  Surgeons and Role:    * [name], MD - Primary  PHYSICIAN ASSISTANT:   ASSISTANTS: none   ANESTHESIA:   IV sedation  EBL:  10 mL   BLOOD ADMINISTERED:none  DRAINS: none   LOCAL MEDICATIONS USED:  LIDOCAINE   SPECIMEN:  retained products of conception  DISPOSITION OF SPECIMEN:  PATHOLOGY  COUNTS:  YES  TOURNIQUET:  * No tourniquets in log *  DICTATION:   The patient was taken to the operating room and prepped and draped in a normal sterile fashion. An in out catheter was used to drain the bladder.   A weighted speculum was placed into the vagina and anterior lip of the cervix was grasped with a single-tooth tenaculum.  10 cc of 2% lidocaine was used for cervical block. The uterus sounded to 8 cm. The cervix was then dilated with Shawnie Pons dilators up to 21 to accomodates size 7 suction curette. Suction curettage was performed until minimal tissue returned. Gentle sharp curettage was performed. Suction curettage was performed once again to remove any remaining debris. The tenaculum was removed from the cervix and hemostasis was noted.  All instruments were removed. The count was correct. The patient was transferred to the recovery room in good condition.   PLAN OF CARE: Discharge to home after PACU  PATIENT DISPOSITION:  PACU - hemodynamically stable.    Dr. Reesa Chew

## 2022-09-03 NOTE — Anesthesia Preprocedure Evaluation (Addendum)
Anesthesia Evaluation  Patient identified by MRN, date of birth, ID band Patient awake    Reviewed: Allergy & Precautions, H&P , NPO status , Patient's Chart, lab work & pertinent test results  Airway Mallampati: II  TM Distance: >3 FB Neck ROM: Full    Dental no notable dental hx. (+) Teeth Intact, Dental Advisory Given   Pulmonary neg pulmonary ROS   Pulmonary exam normal breath sounds clear to auscultation       Cardiovascular negative cardio ROS  Rhythm:Regular Rate:Normal     Neuro/Psych   Anxiety     TIA   GI/Hepatic negative GI ROS, Neg liver ROS,,,  Endo/Other  Hypothyroidism    Renal/GU negative Renal ROS  negative genitourinary   Musculoskeletal   Abdominal   Peds  Hematology negative hematology ROS (+)   Anesthesia Other Findings   Reproductive/Obstetrics negative OB ROS                             Anesthesia Physical Anesthesia Plan  ASA: 2  Anesthesia Plan: General   Post-op Pain Management: Tylenol PO (pre-op)* and Toradol IV (intra-op)*   Induction: Intravenous  PONV Risk Score and Plan: 4 or greater and Ondansetron, Dexamethasone, Propofol infusion, TIVA and Midazolam  Airway Management Planned: LMA  Additional Equipment:   Intra-op Plan:   Post-operative Plan: Extubation in OR  Informed Consent: I have reviewed the patients History and Physical, chart, labs and discussed the procedure including the risks, benefits and alternatives for the proposed anesthesia with the patient or authorized representative who has indicated his/her understanding and acceptance.     Dental advisory given  Plan Discussed with: CRNA  Anesthesia Plan Comments:        Anesthesia Quick Evaluation

## 2022-09-03 NOTE — Discharge Instructions (Signed)
No acetaminophen/Tylenol until after 5:20 pm today if needed.  No ibuprofen, Advil, Aleve, Motrin, ketorolac, meloxicam, naproxen, or other NSAIDS until after 6:21 pm today if needed.        Post Anesthesia Home Care Instructions  Activity: Get plenty of rest for the remainder of the day. A responsible individual must stay with you for 24 hours following the procedure.  For the next 24 hours, DO NOT: -Drive a car -Advertising copywriter -Drink alcoholic beverages -Take any medication unless instructed by your physician -Make any legal decisions or sign important papers.  Meals: Start with liquid foods such as gelatin or soup. Progress to regular foods as tolerated. Avoid greasy, spicy, heavy foods. If nausea and/or vomiting occur, drink only clear liquids until the nausea and/or vomiting subsides. Call your physician if vomiting continues.  Special Instructions/Symptoms: Your throat may feel dry or sore from the anesthesia or the breathing tube placed in your throat during surgery. If this causes discomfort, gargle with warm salt water. The discomfort should disappear within 24 hours.

## 2022-09-03 NOTE — Progress Notes (Signed)
PACU RN reported pt had some "chest tightness" during PACU, now decreasing. PACU RN Cicero Duck stated Dr. Sampson Goon with anesthesiology assessed pt, no new orders given. Upon admission to Phase II, pt VSS, denies acute pain, denies SOB, remains asymptomatic. States "chest tightness is better and almost gone". Dr. Hester Mates to bedside to assess. ISP provided and instructions given. Husband at bedside, instructions given at D/C to notify MD or seek emergent care if symptoms worsen. Pt verbalized understanding. Pt remained asymptomatic at D/C.   Frances Furbish, RN

## 2022-09-04 ENCOUNTER — Encounter (HOSPITAL_BASED_OUTPATIENT_CLINIC_OR_DEPARTMENT_OTHER): Payer: Self-pay | Admitting: Obstetrics and Gynecology

## 2022-09-04 ENCOUNTER — Ambulatory Visit: Payer: BC Managed Care – PPO | Admitting: Obstetrics & Gynecology

## 2022-09-04 LAB — BETA HCG QUANT (REF LAB): hCG Quant: 1301 m[IU]/mL

## 2022-09-04 LAB — SURGICAL PATHOLOGY

## 2023-10-18 IMAGING — MG MM DIGITAL SCREENING BILAT W/ TOMO AND CAD
8 series · 8 of 24 positions shown · non-contrast
Comparison: Previous exam(s).

CLINICAL DATA: Screening.

EXAM:
DIGITAL SCREENING BILATERAL MAMMOGRAM WITH TOMOSYNTHESIS AND CAD
TECHNIQUE: Bilateral screening digital craniocaudal and mediolateral oblique
mammograms were obtained. Bilateral screening digital breast
tomosynthesis was performed. The images were evaluated with
computer-aided detection.

[R CC synth-2D]
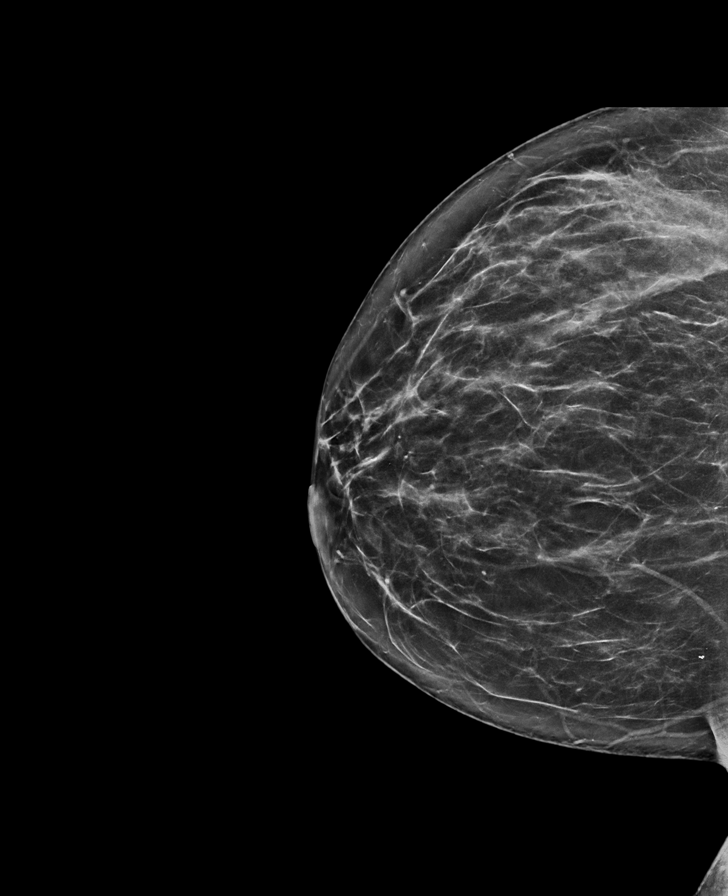

[R MLO synth-2D]
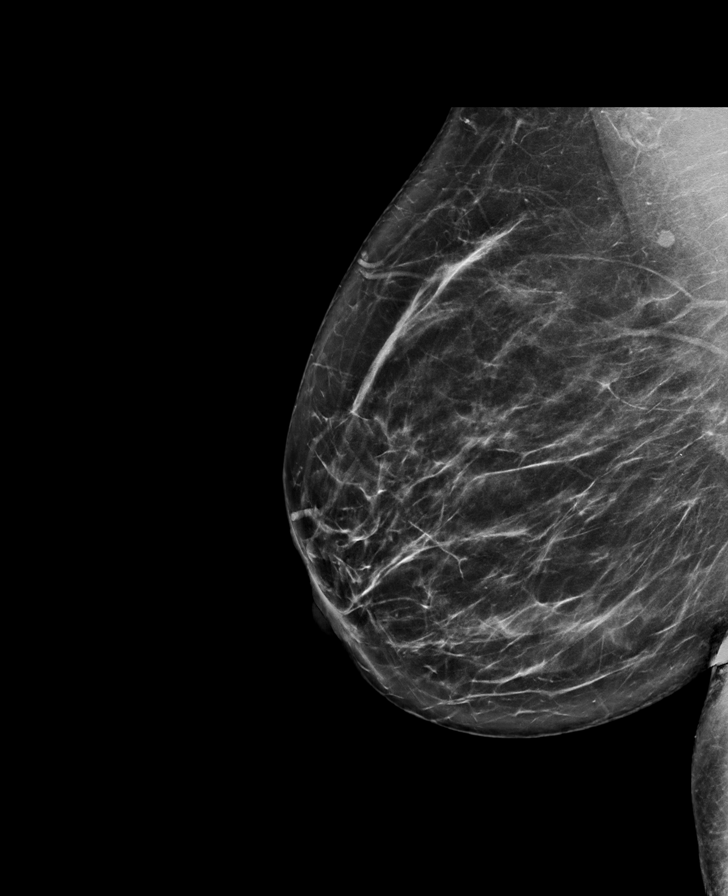

[L MLO synth-2D]
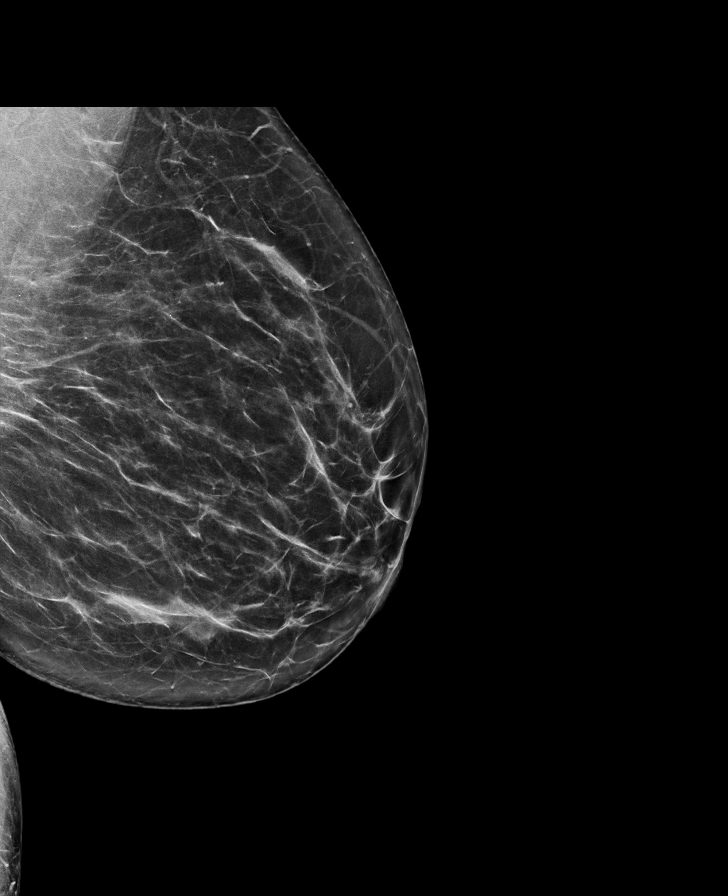

[L CC synth-2D]
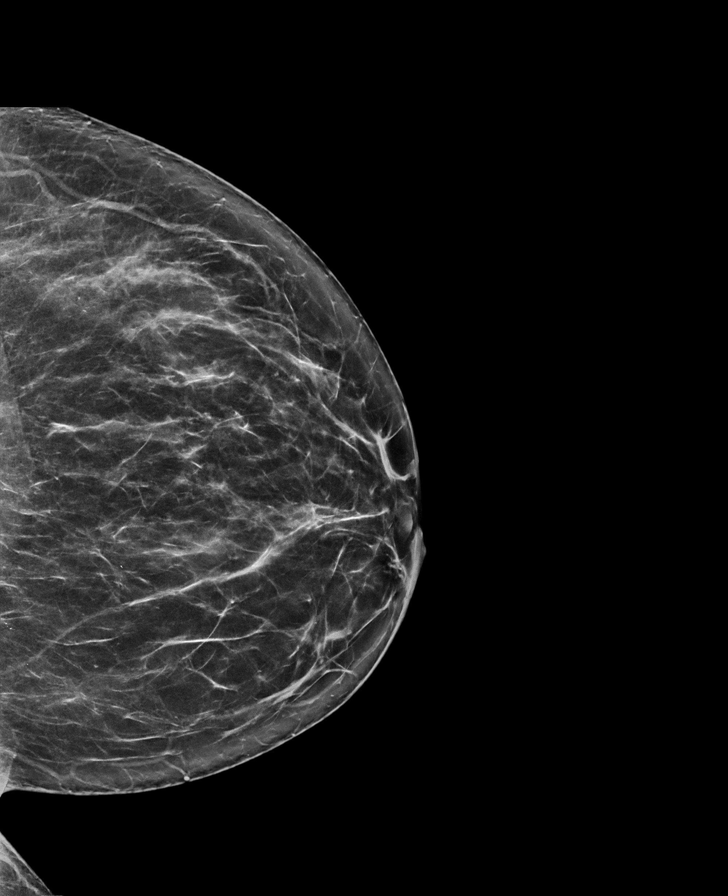

[L CC tomo · tomo slice 37/73.0]
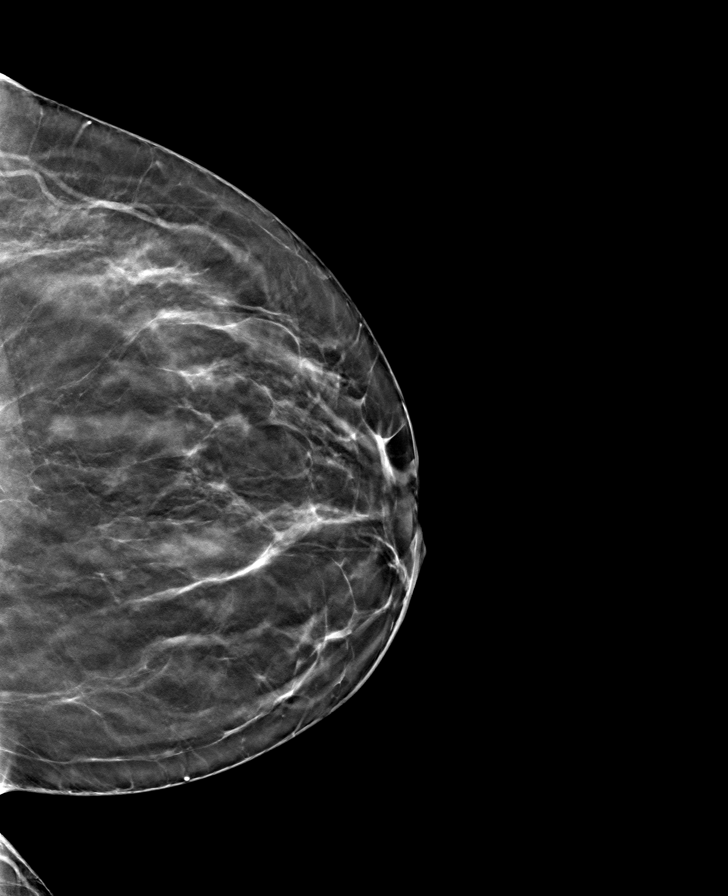

[L MLO tomo · tomo slice 40/79.0]
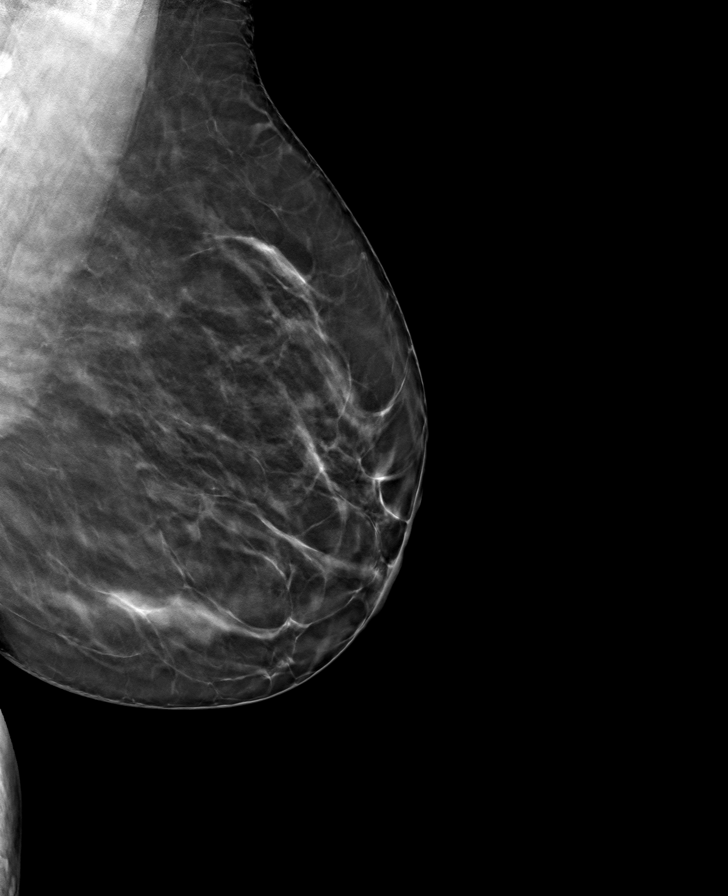

[R MLO tomo · tomo slice 41/81.0]
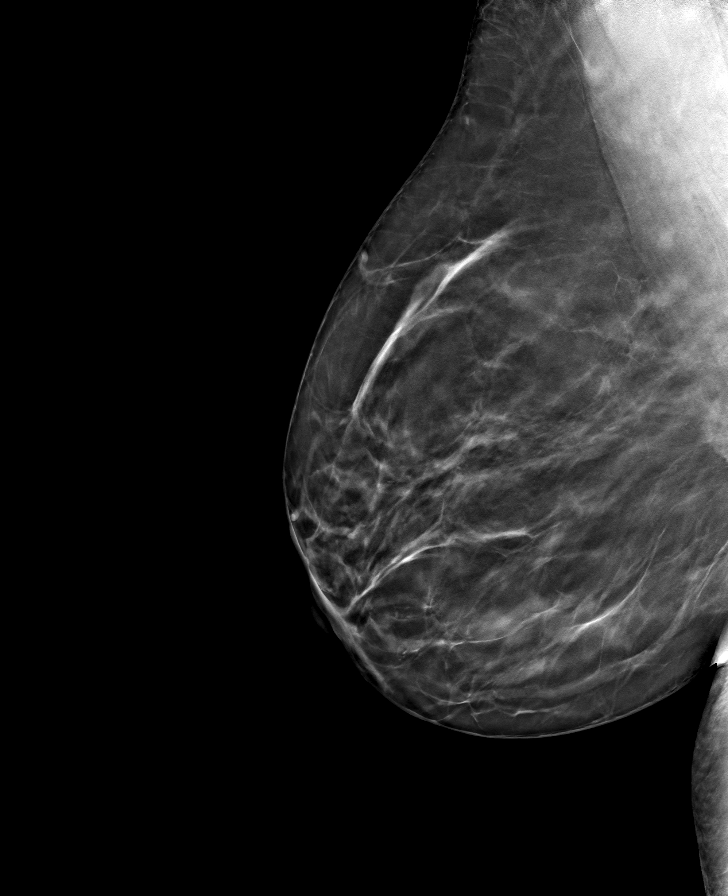

[R CC tomo · tomo slice 36/71.0]
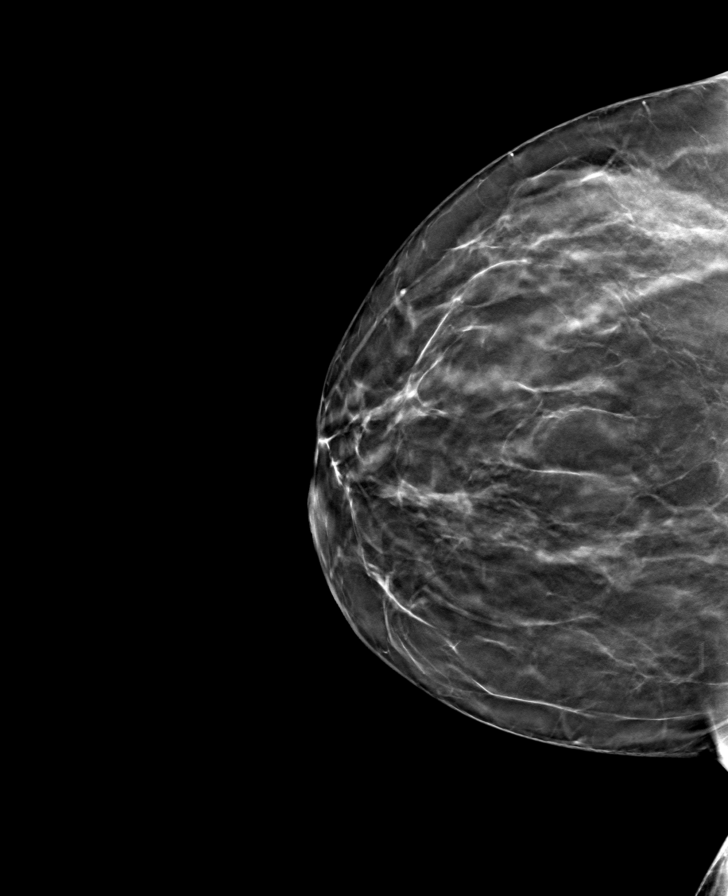

[8 of 24 positions shown; findings below may reference images not displayed]

ACR Breast Density Category b: There are scattered areas of
fibroglandular density.
FINDINGS: There are no findings suspicious for malignancy.
IMPRESSION: No mammographic evidence of malignancy. A result letter of this
screening mammogram will be mailed directly to the patient.

RECOMMENDATION:
Screening mammogram in one year. (Code:51-O-LD2)

BI-RADS CATEGORY  1: Negative.

## 2024-01-20 ENCOUNTER — Ambulatory Visit: Admitting: Plastic Surgery

## 2024-01-20 VITALS — BP 133/86 | HR 89 | Ht 61.0 in | Wt 196.0 lb

## 2024-01-20 DIAGNOSIS — N62 Hypertrophy of breast: Secondary | ICD-10-CM

## 2024-01-23 ENCOUNTER — Encounter: Payer: Self-pay | Admitting: Physical Therapy

## 2024-01-23 ENCOUNTER — Ambulatory Visit: Admitting: Physical Therapy

## 2024-01-23 ENCOUNTER — Other Ambulatory Visit: Payer: Self-pay

## 2024-01-23 DIAGNOSIS — M25619 Stiffness of unspecified shoulder, not elsewhere classified: Secondary | ICD-10-CM | POA: Insufficient documentation

## 2024-01-23 DIAGNOSIS — N62 Hypertrophy of breast: Secondary | ICD-10-CM | POA: Diagnosis not present

## 2024-01-23 DIAGNOSIS — M542 Cervicalgia: Secondary | ICD-10-CM | POA: Diagnosis present

## 2024-01-23 DIAGNOSIS — R29898 Other symptoms and signs involving the musculoskeletal system: Secondary | ICD-10-CM | POA: Insufficient documentation

## 2024-01-23 DIAGNOSIS — M549 Dorsalgia, unspecified: Secondary | ICD-10-CM | POA: Insufficient documentation

## 2024-01-23 NOTE — Therapy (Signed)
 OUTPATIENT PHYSICAL THERAPY THORACOLUMBAR EVALUATION   Patient Name: Kathryn Santos MRN: [id number] DOB:1975-12-16, 48 y.o., female Today's Date: 01/23/2024  END OF SESSION:  PT End of Session - 01/23/24 0817     Visit Number 1    Number of Visits 16    Date for Recertification  03/19/24    Progress Note Due on Visit 10    PT Start Time 0817    PT Stop Time 0915    PT Time Calculation (min) 58 min    Behavior During Therapy [facility] for tasks assessed/performed          Past Medical History:  Diagnosis Date   Complication of anesthesia 2000   slow to awaken had to spend night   Family history of breast cancer    Family history of cervical cancer    Family history of colon cancer    Family history of lung cancer    Miscarriage 08/2022   Thyroid disease    TIA (transient ischemic attack) 2013   none since, thought to be related to birth control use at the time   Vaginal Pap smear, abnormal    Wears glasses    Past Surgical History:  Procedure Laterality Date   ABDOMINOPLASTY  2010   CHOLECYSTECTOMY     DILATION AND EVACUATION N/A 09/03/2022   Procedure: DILATATION AND EVACUATION;  Surgeon: Manuela Laveda BIRCH, MD;  Location: [facility];  Service: Gynecology;  Laterality: N/A;   ENDOMETRIAL ABLATION  2015   FLUOROSCOPIC TUBAL RECANNULATUON     GALLBLADDER SURGERY     THYROIDECTOMY  02/2007   TUBAL LIGATION     Patient Active Problem List   Diagnosis Date Noted   Amenorrhea 11/30/2020   Encounter for screening mammogram for malignant neoplasm of breast 11/30/2020   Genetic testing 08/19/2020   Family history of colon cancer 08/08/2020   Family history of lung cancer 08/08/2020   Family history of breast cancer 08/08/2020   Family history of cervical cancer 08/08/2020   PTSD (post-traumatic stress disorder) 07/08/2020   Generalized anxiety disorder 07/08/2020   History of cholestasis during pregnancy 03/21/2020   History of gestational diabetes  mellitus 03/16/2020   History of pre-eclampsia 03/16/2020   Infertility, female 02/02/2020   Cervical os stenosis 07/02/2017   Class 1 obesity due to excess calories without serious comorbidity with body mass index (BMI) of 33.0 to 33.9 in adult 06/26/2017   History of reversal of tubal ligation 06/26/2017   Postoperative hypothyroidism 06/26/2017    PCP: Lang Dedra DASEN, MD   REFERRING PROVIDER: Waddell Leonce NOVAK, MD   REFERRING DIAG: N62 (ICD-10-CM) - Hypertrophy of breast   Rationale for Evaluation and Treatment: Rehabilitation  THERAPY DIAG:  Weakness of both arms - Plan: PT plan of care cert/re-cert  Upper back pain - Plan: PT plan of care cert/re-cert  Neck pain - Plan: PT plan of care cert/re-cert  Decreased range of motion of shoulder, unspecified laterality - Plan: PT plan of care cert/re-cert  Painful cervical ROM - Plan: PT plan of care cert/re-cert  ONSET DATE: > 6 months  SUBJECTIVE:  SUBJECTIVE STATEMENT: Pt reports that she is in pain every single day. Reports that nothing has helped. Reports that she had 2 PT treatments last year for neck and back pain, but limited due to schedule conflicts.  States that she is still doing some stretching from prior PT treatments as well as performed Yoga and heat for pain management. States that OTC pain meds are not helping, so she has stopping taking them.   Would like to avoid surgery if possible.   PERTINENT HISTORY:  In 2013 pt reports TIA/mini stroke. States that she required 4-5 months of rehab services to regain  full LLE/LUE and speech function.   Was told by hospitalist that she may have DDD via imaging, but not confirmed in documentation.   Upper Back pain has been present for > 6 months and is constant in nature. Feels like she  is getting pulled forward constantly, increasing pain. States that she has been able to cope with pain through yoga, stretching, massage, but relief will last only for an hour at most.    PAIN:  Are you having pain? Yes: NPRS scale: 7/10 currently. 9/10 at worst.  Pain location: shoulders and upper back and shoulder blades . Mainly. Will occasionally radiates into the low back and L LE Pain description: tight achy uncomfortable..  Aggravating factors: prolonged sitting. Walking   Relieving factors: mild relief from lying down, stretching and some yoga positions.    PRECAUTIONS: None  RED FLAGS: None   WEIGHT BEARING RESTRICTIONS: No  FALLS:  Has patient fallen in last 6 months? No  LIVING ENVIRONMENT: Lives with: lives with their family Husband, 3 grown children, 1 teenager and 2 YO grandson.   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 Would like to   NEXT MD VISIT: Jan 26 with with Plastic Surgeon office.   OBJECTIVE:  Note: Objective measures were completed at Evaluation unless otherwise noted.  DIAGNOSTIC FINDINGS:  None recent relevant   PATIENT SURVEYS:  [place] Low Back Pain Disability Questionnaire: @OPRCOSWESTRYLBPDISABILITYQQ @  NDI:  NECK DISABILITY INDEX  Date: 01/23/2024  Score  Pain intensity 3 = The pain is fairly severe at the moment  2. Personal care (washing, dressing, etc.) 2 = It is painful to look after myself and I am slow and careful  3. Lifting 4 =  I can only lift very light weights  4. Reading 3 = I can't read as much as I want because of moderate pain in my neck  5. Headaches 1 =  I have slight headaches, which come infrequently  6. Concentration 2 = I have a fair degree of difficulty in concentrating when I want to  7. Work 2 = I  can do most of my usual work, but no more  8. Driving 3 = I can't drive my car as long as I want because of moderate pain in my neck  9. Sleeping 2 = My sleep is mildly disturbed (1-2 hrs sleepless)  10. Recreation 3 = I am able to engage in a few of my usual recreation activities because of pain in   my neck  Total 25/50   Minimum Detectable Change (90% confidence): 5 points or 10% points  COGNITION: Overall cognitive status: Within functional limits for tasks assessed     SENSATION: Light touch: WFL,  but reports that she will occasionally experience N/T in BUE    POSTURE: forward head and elevated shoulders.   PALPATION: Noted multiple TrP in UT, levator, rhomboids, middle traps, and parascapular mm  Need to assess thoracic and cervical   Cervical ROM:   AROM eval  Flexion 50 *  Extension 50*  Right lateral flexion 30*  Left lateral flexion 40  Right rotation 75  Left rotation 57*   (Blank rows = not tested) *=pain   UPPER EXTREMITY ROM:     Active  Right eval Left eval  Shoulder flexion 150 145*  Shoulder  extension    Shoulder abduction 160 160*  Shoulder adduction [facility]  Shoulder internal rotation  (reach behind back) L4 PSIS*  Hip external rotation  (reach behind head) WFL WFL   (Blank rows = not tested) *=pain  Upper  EXTREMITY MMT:    MMT Right eval Left eval  Shoulder flexion 4+ 4+  Shoulder extension 5 5  Shoulder  abduction 4+ 4  Shoulder  adduction 5 5  Shoulder internal rotation(reach back) 4+ 4+  Shoulder  external rotation 4 4  Elbow flexion 4+ 4+  Elbow extension 4+ 4+   (Blank rows = not tested)  GAIT: Distance walked: 83ft  Assistive device utilized: None Level of assistance: Complete Independence Comments: decreased shoulder rotation. Elevated scapula decreased step length   TREATMENT DATE: 01/23/2024  Education on benefits of PT. TDN. HEP provided as listed below  PATIENT EDUCATION:  Education details: benefits of PT.  TDN. HEP provided as listed below Person educated: Patient Education method: Explanation, Demonstration, Verbal cues, and Handouts Education comprehension: verbalized understanding and returned demonstration  HOME EXERCISE PROGRAM: Access Code: [email or web address] URL: [email or web address] Date: 01/23/2024 Prepared by: Massie Dollar  Exercises - Seated Upper Trapezius Stretch  - 1 x daily - 7 x weekly - 3 sets - 4 reps - 20 seconds  hold - Seated Levator Scapulae Stretch  - 1 x daily - 7 x weekly - 3 sets - 4 reps - 20 seconds  hold - Low Trap Setting at Wall  - 1 x daily - 7 x weekly - 3 sets - 10 reps - 3 seconds hold  ASSESSMENT:  CLINICAL IMPRESSION: Patient is a 48 y.o. female who was seen today for physical therapy evaluation and treatment for upper back pain. Pt noted to have decreased shoulder ROM, increased UT activation and tightness with multiple concordant trigger points noted in UT and parascapular muscles, and winging of scapula with shoulder extension and IR. Reduced cervical ROM with greatest limitation in  R lateral side-bend as well as  L rotation. Functional Shoulder IR greatly limited with pat only able to reach L4 with the R shoulder and L PSIS on the L shoulder. Weakness noted in lower trap and serratus activation with overhead reach. Pt will benefit from skilled pt PT to address pain and ROM deficits to improve overall QoL and allow full return of function    OBJECTIVE IMPAIRMENTS: decreased ROM, decreased strength, hypomobility, increased fascial restrictions, increased muscle spasms, impaired flexibility, impaired sensation, impaired UE functional use, improper body mechanics, postural dysfunction, and pain.   ACTIVITY LIMITATIONS: carrying, lifting, bending, sitting, standing, and dressing  PARTICIPATION LIMITATIONS: cleaning, laundry, driving, shopping, community activity, occupation, and yard work  PERSONAL FACTORS: Fitness, Past/current experiences, Sex,  and Time since onset of injury/illness/exacerbation are also affecting patient's functional outcome.   REHAB POTENTIAL: Good  CLINICAL DECISION MAKING: Stable/uncomplicated  EVALUATION COMPLEXITY: Low  GOALS: Goals reviewed with patient? Yes   SHORT TERM GOALS: Target date: 02/27/2024    Patient will be independent in home exercise program to improve strength/mobility for better functional independence with ADLs. Baseline: initiated on 01/22/24 Goal status: INITIAL   LONG TERM GOALS: Target date: 03/19/2024    Patient will increase NDI  by 10% to demonstrate better functional mobility and better confidence with ADLs.  Baseline: 25/50 (50%) Goal status: INITIAL  2.  Patient will increased cervical Rotation to [facility] without pain to improve safety with driving and care of toddler grandson.  Baseline: pain and ROM deficits with R lateral flexion and L cervical rotation; see above.  Goal status: INITIAL  3.  Patient will increase shoulder IR ROM to reach bra strap to improve independence and function with ADLs/dressing tasks .  Baseline: limited to reach L4 on the R and L PSIS on the L  Goal status: INITIAL  4.  Patient will report pain 5/10 at worst and 2/10 at best to indicate improved fucntional and tolerated with work and home tasks.  Baseline: 7/10 best. 9/10 worst.  Goal status: INITIAL   PLAN:  PT FREQUENCY: 1-2x/week  PT DURATION: 8 weeks  PLANNED INTERVENTIONS: 97164- PT Re-evaluation, 97750- Physical Performance Testing, 97110-Therapeutic exercises, 97530- Therapeutic activity, 97112- Neuromuscular re-education, 97535- Self Care, 02859- Manual therapy, [id number]- Electrical stimulation (unattended), [phone]- Electrical stimulation (manual), [id number]- Ultrasound, 02987- Traction (mechanical), [phone] (1-2 muscles), [id number] (3+ muscles)- Dry Needling, Patient/Family education, Taping, Joint mobilization, Joint manipulation, Spinal manipulation, Spinal mobilization, Scar  mobilization, Compression bandaging, Cryotherapy, Moist heat, and Biofeedback.  PLAN FOR NEXT SESSION:   Complete cervical and thoracic mobility assessment.  Review HEP.  Manual for pain management  TDN for traps and parascapular muscles   Massie FORBES Dollar, PT 01/23/2024, 12:02 PM

## 2024-01-23 NOTE — Progress Notes (Deleted)
 OUTPATIENT PHYSICAL THERAPY THORACOLUMBAR EVALUATION   Patient Name: Kathryn Santos MRN: [id number] DOB:Jan 09, 1976, 48 y.o., female Today's Date: 01/23/2024  END OF SESSION:   Past Medical History:  Diagnosis Date   Complication of anesthesia 2000   slow to awaken had to spend night   Family history of breast cancer    Family history of cervical cancer    Family history of colon cancer    Family history of lung cancer    Miscarriage 08/2022   Thyroid disease    TIA (transient ischemic attack) 2013   none since, thought to be related to birth control use at the time   Vaginal Pap smear, abnormal    Wears glasses    Past Surgical History:  Procedure Laterality Date   ABDOMINOPLASTY  2010   CHOLECYSTECTOMY     DILATION AND EVACUATION N/A 09/03/2022   Procedure: DILATATION AND EVACUATION;  Surgeon: Manuela Laveda BIRCH, MD;  Location: [facility];  Service: Gynecology;  Laterality: N/A;   ENDOMETRIAL ABLATION  2015   FLUOROSCOPIC TUBAL RECANNULATUON     GALLBLADDER SURGERY     THYROIDECTOMY  02/2007   TUBAL LIGATION     Patient Active Problem List   Diagnosis Date Noted   Amenorrhea 11/30/2020   Encounter for screening mammogram for malignant neoplasm of breast 11/30/2020   Genetic testing 08/19/2020   Family history of colon cancer 08/08/2020   Family history of lung cancer 08/08/2020   Family history of breast cancer 08/08/2020   Family history of cervical cancer 08/08/2020   PTSD (post-traumatic stress disorder) 07/08/2020   Generalized anxiety disorder 07/08/2020   History of cholestasis during pregnancy 03/21/2020   History of gestational diabetes mellitus 03/16/2020   History of pre-eclampsia 03/16/2020   Infertility, female 02/02/2020   Cervical os stenosis 07/02/2017   Class 1 obesity due to excess calories without serious comorbidity with body mass index (BMI) of 33.0 to 33.9 in adult 06/26/2017   History of reversal of tubal ligation 06/26/2017    Postoperative hypothyroidism 06/26/2017    PCP: ***  REFERRING PROVIDER: ***  REFERRING DIAG: ***  Rationale for Evaluation and Treatment: {HABREHAB:27488}  THERAPY DIAG:  No diagnosis found.  ONSET DATE: ***  SUBJECTIVE:                                                                                                                                                                                           SUBJECTIVE STATEMENT: ***  PERTINENT HISTORY:  ***  PAIN:  Are you having pain? {OPRCPAIN:27236}  PRECAUTIONS: {Therapy precautions:24002}  RED FLAGS: {PT Red Flags:29287}   WEIGHT BEARING  RESTRICTIONS: {Yes ***/No:24003}  FALLS:  Has patient fallen in last 6 months? {fallsyesno:27318}  LIVING ENVIRONMENT: Lives with: {[facility] lives with:25569::lives with their family} Lives in: {Lives in:25570} Stairs: {opstairs:27293} Has following equipment at home: {Assistive devices:23999}  OCCUPATION: ***  PLOF: {PLOF:24004}  PATIENT GOALS: ***  NEXT MD VISIT: ***  OBJECTIVE:  Note: Objective measures were completed at Evaluation unless otherwise noted.  DIAGNOSTIC FINDINGS:  ***  PATIENT SURVEYS:  {rehab surveys:24030}  COGNITION: Overall cognitive status: {cognition:24006}     SENSATION: {sensation:27233}  MUSCLE LENGTH: Hamstrings: Right *** deg; Left *** deg Debby test: Right *** deg; Left *** deg  POSTURE: {posture:25561}  PALPATION: ***  LUMBAR ROM:   AROM eval  Flexion   Extension   Right lateral flexion   Left lateral flexion   Right rotation   Left rotation    (Blank rows = not tested)  LOWER EXTREMITY ROM:     {AROM/PROM:27142}  Right eval Left eval  Hip flexion    Hip extension    Hip abduction    Hip adduction    Hip internal rotation    Hip external rotation    Knee flexion    Knee extension    Ankle dorsiflexion    Ankle plantarflexion    Ankle inversion    Ankle eversion     (Blank rows = not  tested)  LOWER EXTREMITY MMT:    MMT Right eval Left eval  Hip flexion    Hip extension    Hip abduction    Hip adduction    Hip internal rotation    Hip external rotation    Knee flexion    Knee extension    Ankle dorsiflexion    Ankle plantarflexion    Ankle inversion    Ankle eversion     (Blank rows = not tested)  LUMBAR SPECIAL TESTS:  {lumbar special test:25242}  FUNCTIONAL TESTS:  {Functional tests:24029}  GAIT: Distance walked: *** Assistive device utilized: {Assistive devices:23999} Level of assistance: {Levels of assistance:24026} Comments: ***  TREATMENT DATE: ***                                                                                                                                 PATIENT EDUCATION:  Education details: *** Person educated: {Person educated:25204} Education method: {Education Method:25205} Education comprehension: {Education Comprehension:25206}  HOME EXERCISE PROGRAM: ***  ASSESSMENT:  CLINICAL IMPRESSION: Patient is a *** y.o. *** who was seen today for physical therapy evaluation and treatment for ***.   OBJECTIVE IMPAIRMENTS: {opptimpairments:25111}.   ACTIVITY LIMITATIONS: {activitylimitations:27494}  PARTICIPATION LIMITATIONS: {participationrestrictions:25113}  PERSONAL FACTORS: {Personal factors:25162} are also affecting patient's functional outcome.   REHAB POTENTIAL: {rehabpotential:25112}  CLINICAL DECISION MAKING: {clinical decision making:25114}  EVALUATION COMPLEXITY: {Evaluation complexity:25115}   GOALS: Goals reviewed with patient? {yes/no:20286}  SHORT TERM GOALS: Target date: ***  *** Baseline: Goal status: INITIAL  2.  *** Baseline:  Goal status: INITIAL  3.  ***  Baseline:  Goal status: INITIAL  4.  *** Baseline:  Goal status: INITIAL  5.  *** Baseline:  Goal status: INITIAL  6.  *** Baseline:  Goal status: INITIAL  LONG TERM GOALS: Target date: ***  *** Baseline:   Goal status: INITIAL  2.  *** Baseline:  Goal status: INITIAL  3.  *** Baseline:  Goal status: INITIAL  4.  *** Baseline:  Goal status: INITIAL  5.  *** Baseline:  Goal status: INITIAL  6.  *** Baseline:  Goal status: INITIAL  PLAN:  PT FREQUENCY: {rehab frequency:25116}  PT DURATION: {rehab duration:25117}  PLANNED INTERVENTIONS: {rehab planned interventions:25118::97110-Therapeutic exercises,97530- Therapeutic [phone]- Neuromuscular re-education,97535- Self Rjmz,02859- Manual therapy,Patient/Family education}.  PLAN FOR NEXT SESSION: ***   Massie FORBES Dollar, PT 01/23/2024, 7:59 AM

## 2024-01-28 NOTE — Progress Notes (Signed)
 Referring Provider Kathryn Dedra DASEN, MD [phone] [place],  [place] [address]   CC:  Chief Complaint  Patient presents with   Advice Only      Kathryn Santos is an 48 y.o. female.  HPI: Kathryn Santos is a 48 year old female who presents today with complaints of upper back and neck pain which she attributes to the large size of her breast.  Patient states that she has had large breast for many years.  They seem to have gotten larger after pregnancy.  She is interested in discussing options for treatment of the upper back and neck pain  Allergies[1]  Outpatient Encounter Medications as of 01/20/2024  Medication Sig   vitamin E 200 UNIT capsule Take 200 Units by mouth daily.   No facility-administered encounter medications on file as of 01/20/2024.     Past Medical History:  Diagnosis Date   Complication of anesthesia 2000   slow to awaken had to spend night   Family history of breast cancer    Family history of cervical cancer    Family history of colon cancer    Family history of lung cancer    Miscarriage 08/2022   Thyroid disease    TIA (transient ischemic attack) 2013   none since, thought to be related to birth control use at the time   Vaginal Pap smear, abnormal    Wears glasses     Past Surgical History:  Procedure Laterality Date   ABDOMINOPLASTY  2010   CHOLECYSTECTOMY     DILATION AND EVACUATION N/A 09/03/2022   Procedure: DILATATION AND EVACUATION;  Surgeon: Manuela Laveda BIRCH, MD;  Location: [facility];  Service: Gynecology;  Laterality: N/A;   ENDOMETRIAL ABLATION  2015   FLUOROSCOPIC TUBAL RECANNULATUON     GALLBLADDER SURGERY     THYROIDECTOMY  02/2007   TUBAL LIGATION      Family History  Problem Relation Age of Onset   Diabetes Mother    Hypertension Father    Colon cancer Father 3   Cancer Father    Heart disease Father    Cervical cancer Sister    Diabetes Maternal Aunt    Diabetes Maternal Uncle    Hypertension  Paternal Uncle    Diabetes Maternal Grandmother    Stroke Maternal Grandmother    Diabetes Maternal Grandfather    Hypertension Maternal Grandfather    Hypertension Paternal Grandfather    Cancer Paternal Grandfather        lung   Heart disease Paternal Grandfather    Breast cancer Other    Breast cancer Cousin        dx 79s   Esophageal cancer Neg Hx    Rectal cancer Neg Hx     Social History   Social History Narrative   Not on file     Review of Systems General: Denies fevers, chills, weight loss CV: Denies chest pain, shortness of breath, palpitations Breast: Large breasts which she feels contributed to her back pain.  Physical Exam    01/20/2024   10:41 AM 09/03/2022    1:15 PM 09/03/2022    1:00 PM  Vitals with BMI  Height 5' 1    Weight 196 lbs    BMI 37.05    Systolic 133 135 861  Diastolic 86 88 94  Pulse 89 65 83    General:  No acute distress,  Alert and oriented, Non-Toxic, Normal speech and affect Breast: Patient has large breast bilaterally.  No dominant masses on examination. Mammogram: 2022 Assessment/Plan Macromastia: Patient has large breasts which she feels are contributing to her upper back and neck pain I believe that this may be true.  We discussed breast reductions at length including location of the incisions the unpredictable nature of scarring and wound healing the risks of bleeding, infection, and seroma formation.  The risk of nipple loss due to nipple ischemia.  We did discuss the possibility that she may not be able to breast-feed successfully after breast reduction.  At this time she is still considering whether she and her partner would like to try 1 more time for another child.  After discussing breast reduction she would like to think about whether she would like to proceed first.  I think this is very reasonable.  I did not obtain measurements or photographs today.  I will ask her to follow-up after she finishes her physical therapy and  we can discuss breast reductions again and she will let me know if she would like to move forward.  Kathryn Santos 01/28/2024, 4:43 PM         [1]  Allergies Allergen Reactions   Dilaudid [Hydromorphone] Itching and Nausea And Vomiting   Zithromax [Azithromycin] Hives and Other (See Comments)    Stiffness in face   Mushroom Extract Complex (Obsolete) Hives

## 2024-01-30 ENCOUNTER — Ambulatory Visit: Admitting: Physical Therapy

## 2024-01-30 DIAGNOSIS — M25619 Stiffness of unspecified shoulder, not elsewhere classified: Secondary | ICD-10-CM

## 2024-01-30 DIAGNOSIS — R29898 Other symptoms and signs involving the musculoskeletal system: Secondary | ICD-10-CM

## 2024-01-30 DIAGNOSIS — M549 Dorsalgia, unspecified: Secondary | ICD-10-CM

## 2024-01-30 DIAGNOSIS — M542 Cervicalgia: Secondary | ICD-10-CM

## 2024-01-30 NOTE — Therapy (Signed)
 OUTPATIENT PHYSICAL THERAPY THORACOLUMBAR Treatment    Patient Name: Kathryn Santos MRN: [id number] DOB:1975-03-13, 48 y.o., female Today's Date: 01/30/2024  END OF SESSION:  PT End of Session - 01/30/24 0812     Visit Number 2    Number of Visits 16    Date for Recertification  03/19/24    Progress Note Due on Visit 10    PT Start Time 0813    PT Stop Time 0845    PT Time Calculation (min) 32 min    Activity Tolerance Patient tolerated treatment well    Behavior During Therapy [facility] for tasks assessed/performed          Past Medical History:  Diagnosis Date   Complication of anesthesia 2000   slow to awaken had to spend night   Family history of breast cancer    Family history of cervical cancer    Family history of colon cancer    Family history of lung cancer    Miscarriage 08/2022   Thyroid disease    TIA (transient ischemic attack) 2013   none since, thought to be related to birth control use at the time   Vaginal Pap smear, abnormal    Wears glasses    Past Surgical History:  Procedure Laterality Date   ABDOMINOPLASTY  2010   CHOLECYSTECTOMY     DILATION AND EVACUATION N/A 09/03/2022   Procedure: DILATATION AND EVACUATION;  Surgeon: Manuela Laveda BIRCH, MD;  Location: [facility];  Service: Gynecology;  Laterality: N/A;   ENDOMETRIAL ABLATION  2015   FLUOROSCOPIC TUBAL RECANNULATUON     GALLBLADDER SURGERY     THYROIDECTOMY  02/2007   TUBAL LIGATION     Patient Active Problem List   Diagnosis Date Noted   Amenorrhea 11/30/2020   Encounter for screening mammogram for malignant neoplasm of breast 11/30/2020   Genetic testing 08/19/2020   Family history of colon cancer 08/08/2020   Family history of lung cancer 08/08/2020   Family history of breast cancer 08/08/2020   Family history of cervical cancer 08/08/2020   PTSD (post-traumatic stress disorder) 07/08/2020   Generalized anxiety disorder 07/08/2020   History of cholestasis during  pregnancy 03/21/2020   History of gestational diabetes mellitus 03/16/2020   History of pre-eclampsia 03/16/2020   Infertility, female 02/02/2020   Cervical os stenosis 07/02/2017   Class 1 obesity due to excess calories without serious comorbidity with body mass index (BMI) of 33.0 to 33.9 in adult 06/26/2017   History of reversal of tubal ligation 06/26/2017   Postoperative hypothyroidism 06/26/2017    PCP: Lang Dedra DASEN, MD   REFERRING PROVIDER: Waddell Leonce NOVAK, MD   REFERRING DIAG: N62 (ICD-10-CM) - Hypertrophy of breast   Rationale for Evaluation and Treatment: Rehabilitation  THERAPY DIAG:  Weakness of both arms  Upper back pain  Neck pain  Decreased range of motion of shoulder, unspecified laterality  Painful cervical ROM  ONSET DATE: > 6 months  SUBJECTIVE:  SUBJECTIVE STATEMENT: From today. 01/30/2024 Pt reports that it has been a busy morning. Is packing to go on a surprise birthday trip. Reports consistency with HEP has started to reduce tension in Cspine and upper back. Is grateful that she can do stretches anywhere to help with tension management. Does not rate pain this AM.    From Evaluation:  Pt reports that she is in pain every single day. Reports that nothing has helped. Reports that she had 2 PT treatments last year for neck and back pain, but limited due to schedule conflicts.  States that she is still doing some stretching from prior PT treatments as well as performed Yoga and heat for pain management. States that OTC pain meds are not helping, so she has stopping taking them.   Would like to avoid surgery if possible.   PERTINENT HISTORY:  In 2013 pt reports TIA/mini stroke. States that she required 4-5 months of rehab services to regain  full LLE/LUE and speech  function.   Was told by hospitalist that she may have DDD via imaging, but not confirmed in documentation.   Upper Back pain has been present for > 6 months and is constant in nature. Feels like she is getting pulled forward constantly, increasing pain. States that she has been able to cope with pain through yoga, stretching, massage, but relief will last only for an hour at most.    PAIN:  Are you having pain? Yes: NPRS scale: 7/10 currently. 9/10 at worst.  Pain location: shoulders and upper back and shoulder blades . Mainly. Will occasionally radiates into the low back and L LE Pain description: tight achy uncomfortable..  Aggravating factors: prolonged sitting. Walking   Relieving factors: mild relief from lying down, stretching and some yoga positions.    PRECAUTIONS: None  RED FLAGS: None   WEIGHT BEARING RESTRICTIONS: No  FALLS:  Has patient fallen in last 6 months? No  LIVING ENVIRONMENT: Lives with: lives with their family Husband, 3 grown children, 1 teenager and 2 YO grandson.   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 Would like to   NEXT MD VISIT: Jan 26 with with Plastic Surgeon office.   OBJECTIVE:  Note: Objective measures were completed at Evaluation unless otherwise noted.  DIAGNOSTIC FINDINGS:  None recent relevant   PATIENT SURVEYS:  [place] Low Back Pain Disability Questionnaire: @OPRCOSWESTRYLBPDISABILITYQQ @  NDI:  NECK DISABILITY INDEX  Date: 01/23/2024  Score  Pain intensity 3 = The pain is fairly severe at the moment  2. Personal care (washing, dressing, etc.) 2 = It is painful to look after myself and I am slow and careful  3. Lifting 4 =  I can only lift very light weights  4. Reading 3 = I can't read as much as I want because  of moderate pain in my neck  5. Headaches 1 =  I have slight headaches, which come infrequently  6. Concentration 2 = I have a fair degree of difficulty in concentrating when I want to  7. Work 2 = I can do most of my usual work, but no more  8. Driving 3 = I can't drive my car as long as I want because of moderate pain in my neck  9. Sleeping 2 = My sleep is mildly disturbed (1-2 hrs sleepless)  10. Recreation 3 = I am able to engage in a few of my usual recreation activities because of pain in   my neck  Total 25/50   Minimum Detectable Change (90% confidence): 5 points or 10% points  COGNITION: Overall cognitive status: Within functional limits for tasks assessed     SENSATION: Light touch: WFL, but reports that she will occasionally experience N/T in BUE    POSTURE: forward head and elevated shoulders.   PALPATION: Noted multiple TrP in UT, levator, rhomboids, middle traps, and parascapular mm  Noted reduced joint mobility in upper thoracic spine T6 and above as well as reduced cervical joint mobility.     Cervical ROM:   AROM eval  Flexion 50 *  Extension 50*  Right lateral flexion 30*  Left lateral flexion 40  Right rotation 75  Left rotation 57*   (Blank rows = not tested) *=pain  Thoracic  AROM eval  Flexion WFL *  Extension WFL*  Right lateral flexion 60  Left lateral flexion 55*  Right rotation 70  Left rotation 60*   (Blank rows = not tested) *=pain   UPPER EXTREMITY ROM:     Active  Right eval Left eval  Shoulder flexion 150 145*  Shoulder  extension    Shoulder abduction 160 160*  Shoulder adduction [facility]  Shoulder internal rotation  (reach behind back) L4 PSIS*  Hip external rotation  (reach behind head) WFL WFL   (Blank rows = not tested) *=pain  Upper  EXTREMITY MMT:    MMT Right eval Left eval  Shoulder flexion 4+ 4+  Shoulder extension 5 5  Shoulder  abduction 4+ 4  Shoulder  adduction 5 5  Shoulder internal rotation(reach  back) 4+ 4+  Shoulder  external rotation 4 4  Elbow flexion 4+ 4+  Elbow extension 4+ 4+   (Blank rows = not tested)  GAIT: Distance walked: 65ft  Assistive device utilized: None Level of assistance: Complete Independence Comments: decreased shoulder rotation. Elevated scapula decreased step length   TREATMENT DATE: 01/30/2024  PT completed thoracic ROM and spinal  mobility assessment.   UBE 1.5 min forward/1.5 min reverse. 30 sec rest break between bouts. Pt reports greater difficulty with reverse; cues for reduced shoulder elevation with reverse motion  Prone STM for lumbar and thoracic paraspinals. Noted TP in lower thoracic paraspinals, R side lower traps, and L side mid trap/rhomboids.  Thoracic CPA grade 2-3 mobilizations x 10-15 sec per segment T2-T10. Thoracic UPA mobilizations 15 sec per segment bil T2-T8.  Supine STM for UT and cervical paraspinals with TrP release to UT.  Suboccipital release x 1 min  Cervical upglides x 2 min.  Cervical traction x 1 min  Cervical sidebend for UT stretch x 20 sec bil  Levator stretch x 30 sce bil   Supine chin tucks x 10  Supine ER with scap setting YTB x 10   PATIENT EDUCATION:  Education details: benefits of PT. TDN. HEP provided as listed below Person educated: Patient Education method: Explanation, Demonstration, Verbal cues, and Handouts Education comprehension: verbalized understanding and returned demonstration  HOME EXERCISE PROGRAM: Access Code: [email or web address] URL: [email or web address] Date: 01/30/2024 Prepared by: Massie Dollar  Exercises - Seated Upper Trapezius Stretch  - 1 x daily - 7 x weekly - 3 sets - 4 reps - 20 seconds  hold - Seated Levator Scapulae Stretch  - 1 x daily - 7 x weekly - 3 sets - 4 reps - 20 seconds  hold -  Low Trap Setting at Wall  - 1 x daily - 7 x weekly - 3 sets - 10 reps - 3 seconds hold - Supine Cervical Retraction with Towel  - 1 x daily - 7 x weekly - 3 sets - 10 reps - Supine  Shoulder External Rotation with Resistance  - 1 x daily - 7 x weekly - 3 sets - 10 reps  ASSESSMENT:  CLINICAL IMPRESSION: Patient is a 48 y.o. female who was seen today for physical therapy treatment for upper back pain. Pt noted to have decreased thoracic ROM, increased UT activation and tightness with multiple concordant trigger points noted in UT and parascapular muscles, extensive manual therapy provided on this day for thoracic and cervical stiffness related to reduced joint mobility and multiple concordant TrP. HEP expanded to improved lower parascapular activation and address cervical stiffness. TDN not performed on this day as pt is planning on going on out of state vacation over the weekend to an undisclosed location. Will consider TDN when patient returns to . Pt will benefit from skilled pt PT to address pain and ROM deficits to improve overall QoL and allow full return of function    OBJECTIVE IMPAIRMENTS: decreased ROM, decreased strength, hypomobility, increased fascial restrictions, increased muscle spasms, impaired flexibility, impaired sensation, impaired UE functional use, improper body mechanics, postural dysfunction, and pain.   ACTIVITY LIMITATIONS: carrying, lifting, bending, sitting, standing, and dressing  PARTICIPATION LIMITATIONS: cleaning, laundry, driving, shopping, community activity, occupation, and yard work  PERSONAL FACTORS: Fitness, Past/current experiences, Sex, and Time since onset of injury/illness/exacerbation are also affecting patient's functional outcome.   REHAB POTENTIAL: Good  CLINICAL DECISION MAKING: Stable/uncomplicated  EVALUATION COMPLEXITY: Low   GOALS: Goals reviewed with patient? Yes   SHORT TERM GOALS: Target date: 02/27/2024    Patient will be independent in home exercise program to improve strength/mobility for better functional independence with ADLs. Baseline: initiated on 01/22/24 Goal status: INITIAL   LONG TERM GOALS:  Target date: 03/19/2024    Patient will increase NDI  by 10% to demonstrate better functional mobility and better confidence with ADLs.  Baseline: 25/50 (50%) Goal status: INITIAL  2.  Patient will increased cervical Rotation to [facility] without pain to improve safety with driving and care of toddler grandson.  Baseline: pain and ROM deficits with R lateral flexion and L cervical rotation; see above.  Goal status: INITIAL  3.  Patient will increase shoulder IR ROM to reach bra strap to improve independence and function with ADLs/dressing tasks .  Baseline: limited to reach L4 on the R and L PSIS on the L  Goal status: INITIAL  4.  Patient will report pain 5/10 at worst and 2/10 at best to indicate improved fucntional and tolerated with work and home tasks.  Baseline: 7/10 best. 9/10 worst.  Goal status: INITIAL   PLAN:  PT FREQUENCY: 1-2x/week  PT DURATION: 8 weeks  PLANNED INTERVENTIONS: 97164- PT Re-evaluation, 97750- Physical Performance Testing, 97110-Therapeutic exercises, 97530- Therapeutic activity, 97112- Neuromuscular re-education, 97535- Self Care, 02859- Manual therapy, [id number]- Electrical stimulation (unattended), [phone]- Electrical stimulation (manual), [id number]- Ultrasound, 02987- Traction (mechanical), [phone] (1-2 muscles), 20561 (3+ muscles)- Dry Needling, Patient/Family education, Taping, Joint mobilization, Joint manipulation, Spinal manipulation, Spinal mobilization, Scar mobilization, Compression bandaging, Cryotherapy, Moist heat, and Biofeedback.  PLAN FOR NEXT SESSION:    Review HEP Thoracic mobility therex.  Manual for pain management  TDN for traps and parascapular muscles   Massie FORBES Dollar, PT 01/30/2024, 8:12 AM

## 2024-02-03 ENCOUNTER — Ambulatory Visit: Admitting: Physical Therapy

## 2024-02-07 IMAGING — US US THYROID
1 series · 14 of 25 positions shown · non-contrast
Comparison: None.

CLINICAL DATA: 45-year-old female with a history thyroid goiter

EXAM:
THYROID ULTRASOUND
TECHNIQUE: Ultrasound examination of the thyroid gland and adjacent soft
tissues was performed.

[Series 1: us thyroid · 0.06mm/px · 14 of 33 slices shown]
[im 1/33]
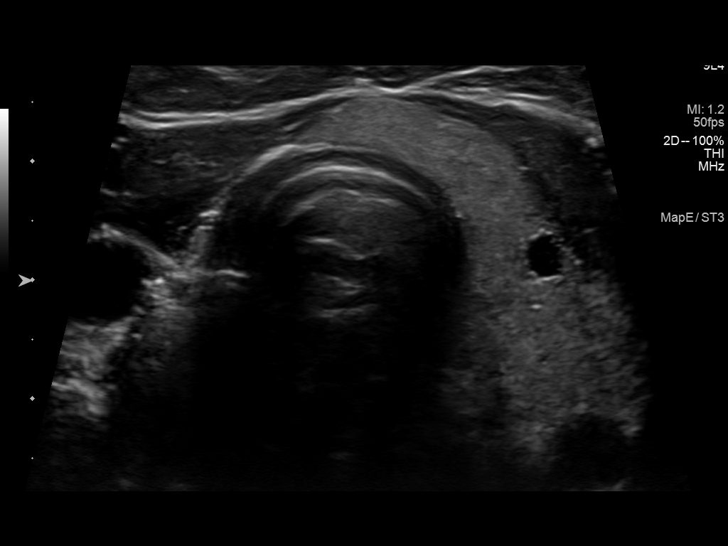
[im 3/33]
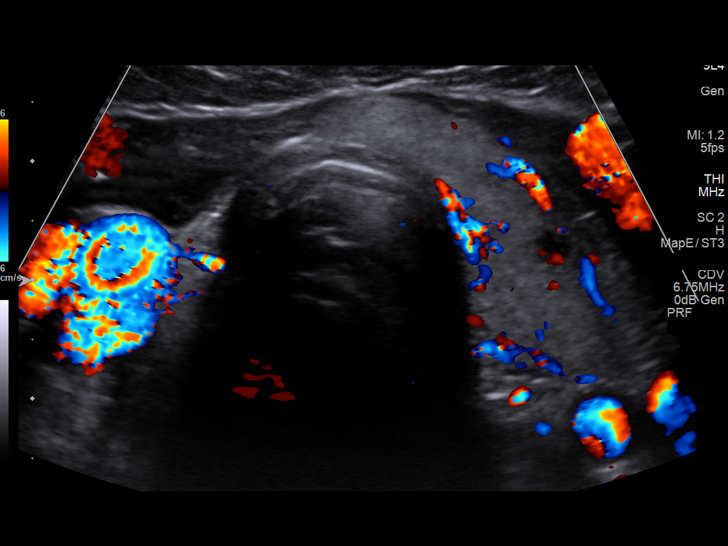
[im 6/33]
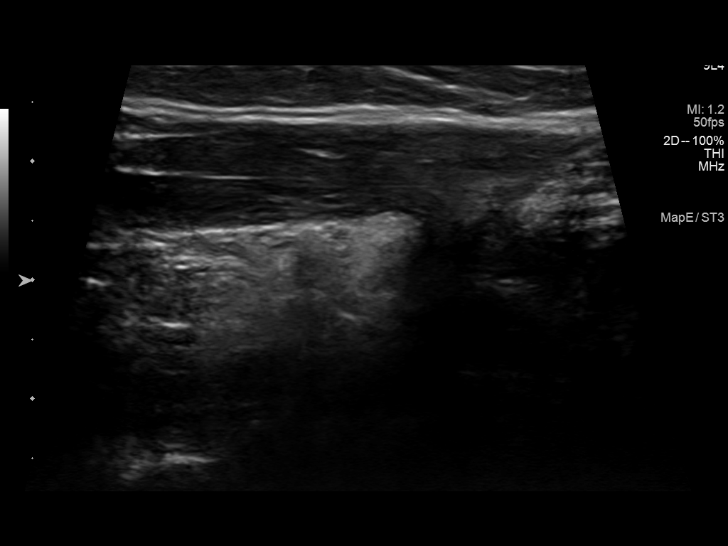
[im 9/33]
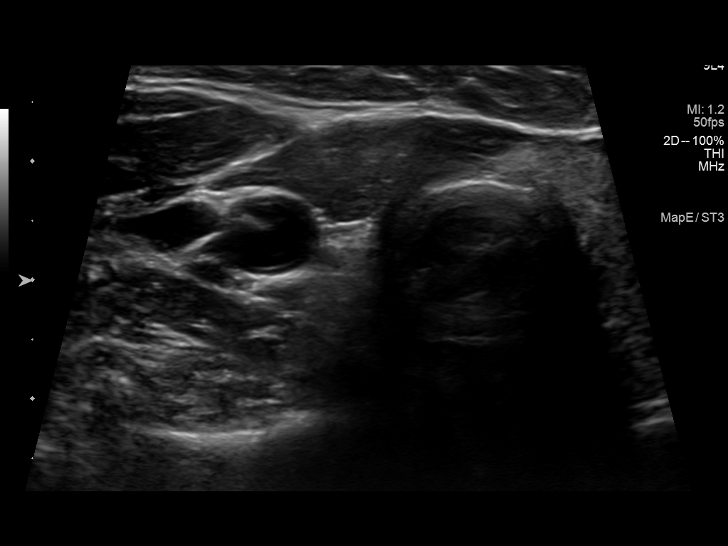
[im 11/33]
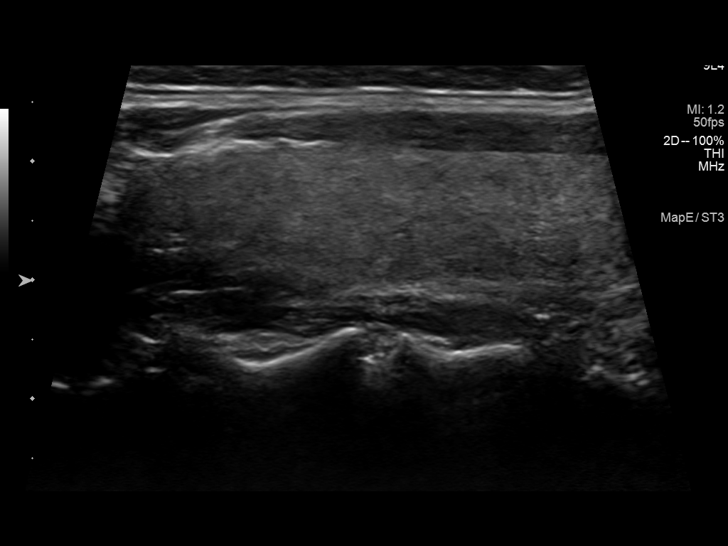
[im 13/33]
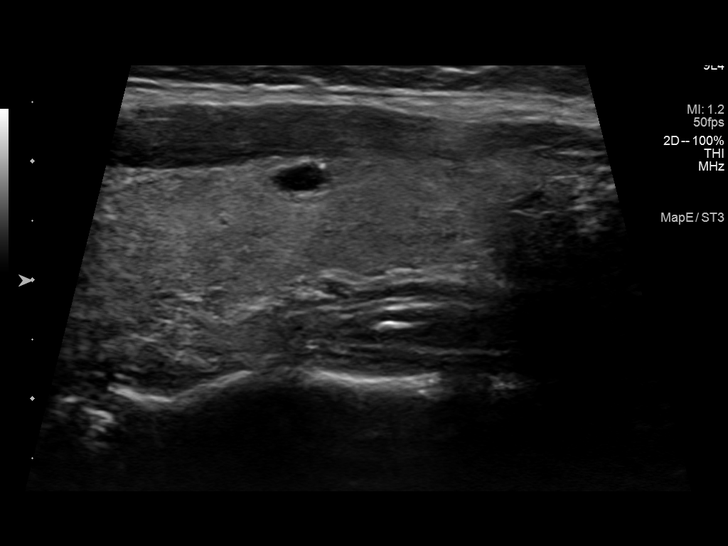
[im 15/33]
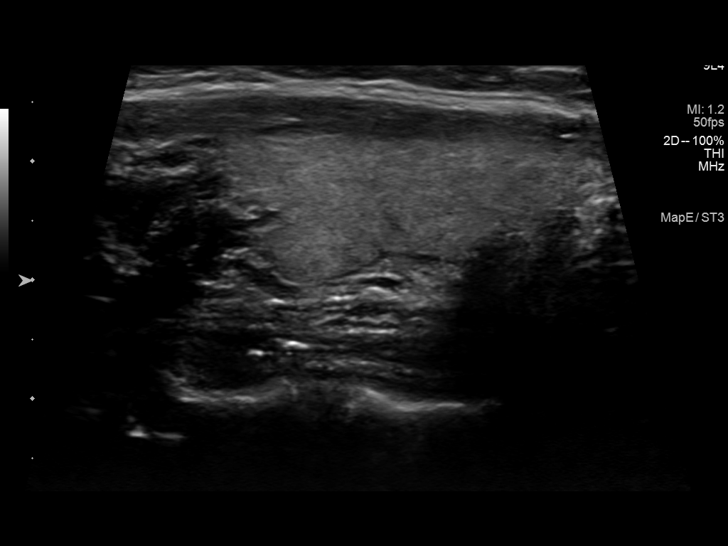
[im 18/33]
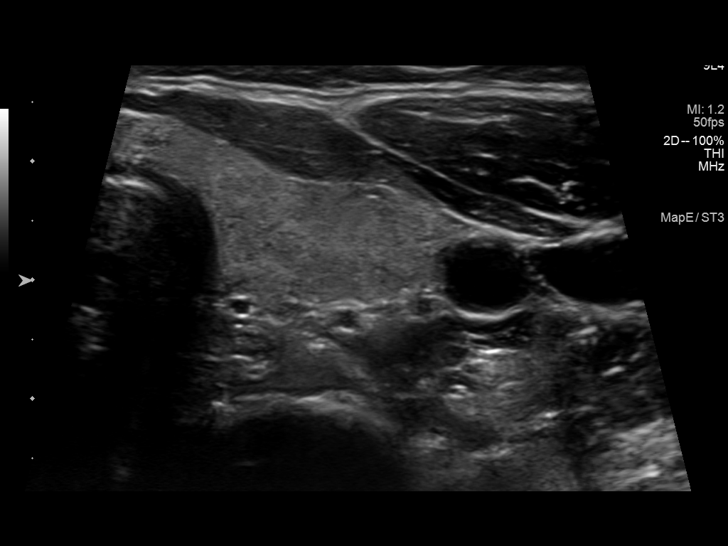
[im 21/33]
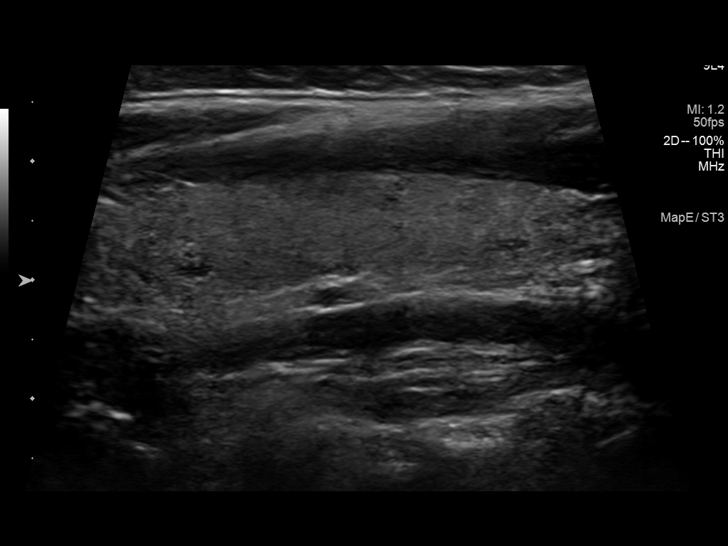
[im 22/33]
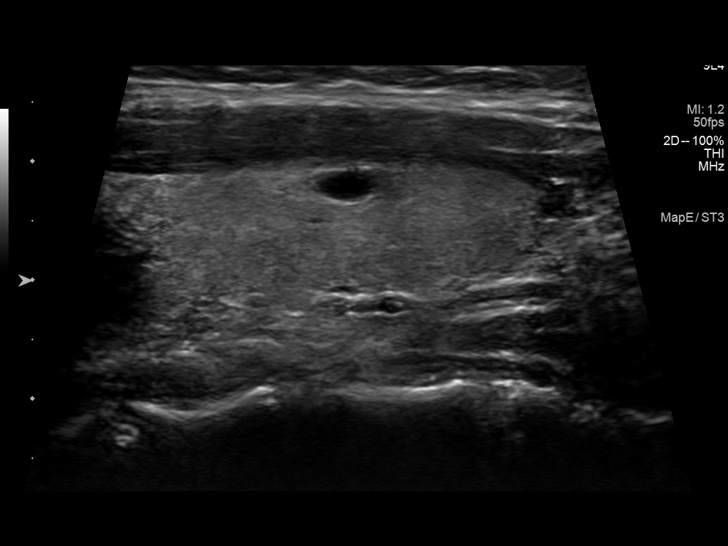
[im 25/33]
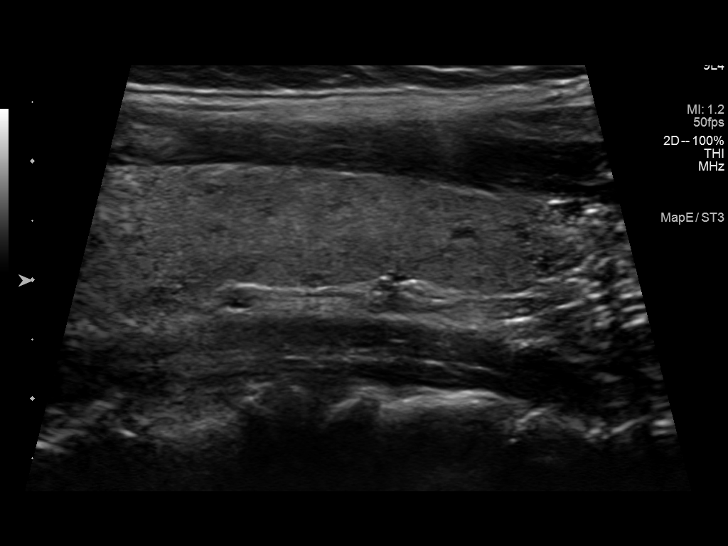
[im 27/33]
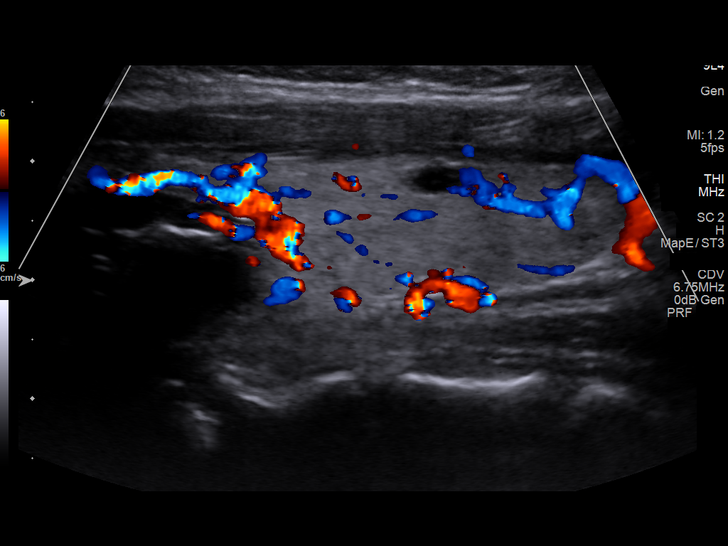
[im 30/33]
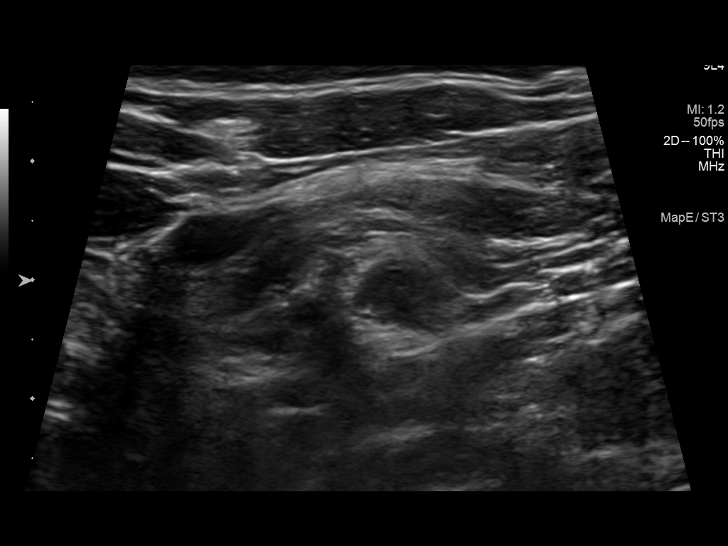
[im 33/33]
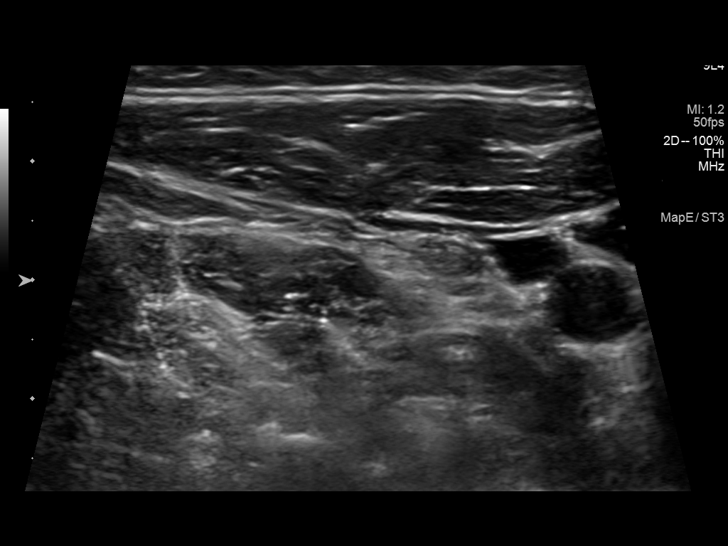

[14 of 25 positions shown; findings below may reference images not displayed]

FINDINGS: Parenchymal Echotexture: Normal

Isthmus: 0.4 cm

Right lobe: Right hemithyroidectomy

Left lobe: 4.7 cm x 1.1 cm x 1.9 cm

_________________________________________________________

Estimated total number of nodules >/= 1 cm: 0

Number of spongiform nodules >/=  2 cm not described below (TR1): 0

Number of mixed cystic and solid nodules >/= 1.5 cm not described
below (TR2): 0

_________________________________________________________

No nodules present which meet criteria for surveillance.

No adenopathy

Recommendations follow those established by the new ACR TI-RADS
criteria ([HOSPITAL] 5327;[DATE]).
IMPRESSION: Right-sided hemithyroidectomy

Unremarkable sonographic survey of the left thyroid.

## 2024-02-14 ENCOUNTER — Ambulatory Visit: Attending: Plastic Surgery

## 2024-02-14 DIAGNOSIS — M542 Cervicalgia: Secondary | ICD-10-CM | POA: Diagnosis present

## 2024-02-14 DIAGNOSIS — R29898 Other symptoms and signs involving the musculoskeletal system: Secondary | ICD-10-CM | POA: Insufficient documentation

## 2024-02-14 DIAGNOSIS — M25619 Stiffness of unspecified shoulder, not elsewhere classified: Secondary | ICD-10-CM | POA: Diagnosis present

## 2024-02-14 DIAGNOSIS — M549 Dorsalgia, unspecified: Secondary | ICD-10-CM | POA: Insufficient documentation

## 2024-02-14 NOTE — Therapy (Signed)
 " OUTPATIENT PHYSICAL THERAPY THORACOLUMBAR Treatment    Patient Name: Kathryn Santos MRN: [id number] DOB:[birth year], 49 y.o., female Today's Date: 02/14/2024  END OF SESSION:  PT End of Session - 02/14/24 0915     Visit Number 3    Number of Visits 16    Date for Recertification  03/19/24    Progress Note Due on Visit 10    PT Start Time 0803    PT Stop Time 0853    PT Time Calculation (min) 50 min    Activity Tolerance Patient tolerated treatment well    Behavior During Therapy [facility] for tasks assessed/performed           Past Medical History:  Diagnosis Date   Complication of anesthesia 2000   slow to awaken had to spend night   Family history of breast cancer    Family history of cervical cancer    Family history of colon cancer    Family history of lung cancer    Miscarriage 08/2022   Thyroid disease    TIA (transient ischemic attack) 2013   none since, thought to be related to birth control use at the time   Vaginal Pap smear, abnormal    Wears glasses    Past Surgical History:  Procedure Laterality Date   ABDOMINOPLASTY  2010   CHOLECYSTECTOMY     DILATION AND EVACUATION N/A 09/03/2022   Procedure: DILATATION AND EVACUATION;  Surgeon: Manuela Laveda BIRCH, MD;  Location: [facility];  Service: Gynecology;  Laterality: N/A;   ENDOMETRIAL ABLATION  2015   FLUOROSCOPIC TUBAL RECANNULATUON     GALLBLADDER SURGERY     THYROIDECTOMY  02/2007   TUBAL LIGATION     Patient Active Problem List   Diagnosis Date Noted   Amenorrhea 11/30/2020   Encounter for screening mammogram for malignant neoplasm of breast 11/30/2020   Genetic testing 08/19/2020   Family history of colon cancer 08/08/2020   Family history of lung cancer 08/08/2020   Family history of breast cancer 08/08/2020   Family history of cervical cancer 08/08/2020   PTSD (post-traumatic stress disorder) 07/08/2020   Generalized anxiety disorder 07/08/2020   History of cholestasis during  pregnancy 03/21/2020   History of gestational diabetes mellitus 03/16/2020   History of pre-eclampsia 03/16/2020   Infertility, female 02/02/2020   Cervical os stenosis 07/02/2017   Class 1 obesity due to excess calories without serious comorbidity with body mass index (BMI) of 33.0 to 33.9 in adult 06/26/2017   History of reversal of tubal ligation 06/26/2017   Postoperative hypothyroidism 06/26/2017    PCP: Lang Dedra DASEN, MD   REFERRING PROVIDER: Waddell Leonce NOVAK, MD   REFERRING DIAG: N62 (ICD-10-CM) - Hypertrophy of breast   Rationale for Evaluation and Treatment: Rehabilitation  THERAPY DIAG:  Weakness of both arms  Upper back pain  Neck pain  Decreased range of motion of shoulder, unspecified laterality  Painful cervical ROM  ONSET DATE: > 6 months  SUBJECTIVE:  SUBJECTIVE STATEMENT: From today. 02/14/2024 Pt reports some immediate relief after last session but then experienced some lower back pain. States holidays went okay- busy but did get in a couple of massages with some short term relief. Reports compliant with initial HEP.     From Evaluation:  Pt reports that she is in pain every single day. Reports that nothing has helped. Reports that she had 2 PT treatments last year for neck and back pain, but limited due to schedule conflicts.  States that she is still doing some stretching from prior PT treatments as well as performed Yoga and heat for pain management. States that OTC pain meds are not helping, so she has stopping taking them.   Would like to avoid surgery if possible.   PERTINENT HISTORY:  In 2013 pt reports TIA/mini stroke. States that she required 4-5 months of rehab services to regain  full LLE/LUE and speech function.   Was told by hospitalist that she may have  DDD via imaging, but not confirmed in documentation.   Upper Back pain has been present for > 6 months and is constant in nature. Feels like she is getting pulled forward constantly, increasing pain. States that she has been able to cope with pain through yoga, stretching, massage, but relief will last only for an hour at most.    PAIN:  Are you having pain? Yes: NPRS scale: 7/10 currently. 9/10 at worst.  Pain location: shoulders and upper back and shoulder blades . Mainly. Will occasionally radiates into the low back and L LE Pain description: tight achy uncomfortable..  Aggravating factors: prolonged sitting. Walking   Relieving factors: mild relief from lying down, stretching and some yoga positions.    PRECAUTIONS: None  RED FLAGS: None   WEIGHT BEARING RESTRICTIONS: No  FALLS:  Has patient fallen in last 6 months? No  LIVING ENVIRONMENT: Lives with: lives with their family Husband, 3 grown children, 1 teenager and 2 YO grandson.   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 Would like to   NEXT MD VISIT: Jan 26 with with Plastic Surgeon office.   OBJECTIVE:  Note: Objective measures were completed at Evaluation unless otherwise noted.  DIAGNOSTIC FINDINGS:  None recent relevant   PATIENT SURVEYS:  [place] Low Back Pain Disability Questionnaire: @OPRCOSWESTRYLBPDISABILITYQQ @  NDI:  NECK DISABILITY INDEX  Date: 01/23/2024  Score  Pain intensity 3 = The pain is fairly severe at the moment  2. Personal care (washing, dressing, etc.) 2 = It is painful to look after myself and I am slow and careful  3. Lifting 4 =  I can only lift very light weights  4. Reading 3 = I can't read as much as I want because of moderate pain in my neck  5. Headaches 1 =  I have  slight headaches, which come infrequently  6. Concentration 2 = I have a fair degree of difficulty in concentrating when I want to  7. Work 2 = I can do most of my usual work, but no more  8. Driving 3 = I can't drive my car as long as I want because of moderate pain in my neck  9. Sleeping 2 = My sleep is mildly disturbed (1-2 hrs sleepless)  10. Recreation 3 = I am able to engage in a  few of my usual recreation activities because of pain in   my neck  Total 25/50   Minimum Detectable Change (90% confidence): 5 points or 10% points  COGNITION: Overall cognitive status: Within functional limits for tasks assessed     SENSATION: Light touch: WFL, but reports that she will occasionally experience N/T in BUE    POSTURE: forward head and elevated shoulders.   PALPATION: Noted multiple TrP in UT, levator, rhomboids, middle traps, and parascapular mm  Noted reduced joint mobility in upper thoracic spine T6 and above as well as reduced cervical joint mobility.     Cervical ROM:   AROM eval  Flexion 50 *  Extension 50*  Right lateral flexion 30*  Left lateral flexion 40  Right rotation 75  Left rotation 57*   (Blank rows = not tested) *=pain  Thoracic  AROM eval  Flexion WFL *  Extension WFL*  Right lateral flexion 60  Left lateral flexion 55*  Right rotation 70  Left rotation 60*   (Blank rows = not tested) *=pain   UPPER EXTREMITY ROM:     Active  Right eval Left eval  Shoulder flexion 150 145*  Shoulder  extension    Shoulder abduction 160 160*  Shoulder adduction [facility]  Shoulder internal rotation  (reach behind back) L4 PSIS*  Hip external rotation  (reach behind head) WFL WFL   (Blank rows = not tested) *=pain  Upper  EXTREMITY MMT:    MMT Right eval Left eval  Shoulder flexion 4+ 4+  Shoulder extension 5 5  Shoulder  abduction 4+ 4  Shoulder  adduction 5 5  Shoulder internal rotation(reach back) 4+ 4+  Shoulder  external rotation 4 4  Elbow  flexion 4+ 4+  Elbow extension 4+ 4+   (Blank rows = not tested)  GAIT: Distance walked: 66ft  Assistive device utilized: None Level of assistance: Complete Independence Comments: decreased shoulder rotation. Elevated scapula decreased step length   TREATMENT DATE: 02/14/2024    TE: SciFit at level 4- 2.5 min forward/2.5 min reverse. 30 sec rest break between bouts. No pain reported  Manual therapy: -Prone extensive STM for cervical and thoracic paraspinals and then B UT/levator scap region.  Noted Several Taut bands along both scapular borders but more tightness associated with Left side.  Thoracic grade 3-4 CPA mobilizations x 10-15 sec per segment T2-T10. Thoracic UPA mobilizations 15 sec per segment bil T2-T8.    Trigger Point Dry Needling  Initial Treatment: Pt instructed on Dry Needling rational, procedures, and possible side effects. Pt instructed to expect mild to moderate muscle soreness later in the day and/or into the next day.  Pt instructed in methods to reduce muscle soreness. Pt instructed to continue prescribed HEP. Because Dry Needling was performed over or adjacent to a lung field, pt was educated on S/S of pneumothorax and to seek immediate medical attention should they occur.  Patient was educated on signs and symptoms of infection and other risk factors and advised to seek medical attention should they occur.  Patient verbalized understanding of these instructions and education.   Patient Verbal Consent Given: Yes Education Handout Provided: Previously Provided Muscles Treated: B cervical paraspinals and then B levator/Upper trap region Electrical Stimulation Performed: No Treatment Response/Outcome: Several local twitch responses noted on each side (cervical paraspinals and levator/UT region). No immediate adverse reaction. Patient reports immediate relief after treatment.  Self care: Discussion of HEP and discussed      PATIENT EDUCATION:  Education  details: benefits of PT. TDN. HEP provided as listed below Person educated: Patient Education method: Explanation, Demonstration, Verbal cues, and Handouts Education comprehension: verbalized understanding and returned demonstration  HOME EXERCISE PROGRAM: Access Code: [email or web address] URL: [email or web address] Date: 01/30/2024 Prepared by: Massie Dollar  Exercises - Seated Upper Trapezius Stretch  - 1 x daily - 7 x weekly - 3 sets - 4 reps - 20 seconds  hold - Seated Levator Scapulae Stretch  - 1 x daily - 7 x weekly - 3 sets - 4 reps - 20 seconds  hold - Low Trap Setting at Wall  - 1 x daily - 7 x weekly - 3 sets - 10 reps - 3 seconds hold - Supine Cervical Retraction with Towel  - 1 x daily - 7 x weekly - 3 sets - 10 reps - Supine Shoulder External Rotation with Resistance  - 1 x daily - 7 x weekly - 3 sets - 10 reps  ASSESSMENT:  CLINICAL IMPRESSION: Patient is a 49 y.o. female who was seen today for physical therapy treatment for upper back pain. Patient continues to presents with several areas of tightness addressed with extensive STM and later dry needling today. She responded well reporting some immediate relief after session and stating she felt like she could move her neck better. Will continue to add more postural strengthening as appropriate for longer term pain relief.  Pt will benefit from skilled pt PT to address pain and ROM deficits to improve overall QoL and allow full return of function    OBJECTIVE IMPAIRMENTS: decreased ROM, decreased strength, hypomobility, increased fascial restrictions, increased muscle spasms, impaired flexibility, impaired sensation, impaired UE functional use, improper body mechanics, postural dysfunction, and pain.   ACTIVITY LIMITATIONS: carrying, lifting, bending, sitting, standing, and dressing  PARTICIPATION LIMITATIONS: cleaning, laundry, driving, shopping, community activity, occupation, and yard work  PERSONAL FACTORS: Fitness,  Past/current experiences, Sex, and Time since onset of injury/illness/exacerbation are also affecting patient's functional outcome.   REHAB POTENTIAL: Good  CLINICAL DECISION MAKING: Stable/uncomplicated  EVALUATION COMPLEXITY: Low   GOALS: Goals reviewed with patient? Yes   SHORT TERM GOALS: Target date: 02/27/2024    Patient will be independent in home exercise program to improve strength/mobility for better functional independence with ADLs. Baseline: initiated on 01/22/24 Goal status: INITIAL   LONG TERM GOALS: Target date: 03/19/2024    Patient will increase NDI  by 10% to demonstrate better functional mobility and better confidence with ADLs.  Baseline: 25/50 (50%) Goal status: INITIAL  2.  Patient will increased cervical Rotation to [facility] without pain to improve safety with driving and care of toddler grandson.  Baseline: pain and ROM deficits with R lateral flexion and L cervical rotation; see above.  Goal status: INITIAL  3.  Patient will increase shoulder IR ROM to reach bra strap to improve independence and function with ADLs/dressing tasks .  Baseline: limited to reach L4 on the R and L PSIS on the L  Goal status: INITIAL  4.  Patient will report pain 5/10 at worst and 2/10 at best to indicate improved fucntional and tolerated with work and home tasks.  Baseline: 7/10 best. 9/10 worst.  Goal status: INITIAL   PLAN:  PT FREQUENCY: 1-2x/week  PT DURATION: 8 weeks  PLANNED INTERVENTIONS: 97164- PT Re-evaluation, 97750- Physical Performance Testing, 97110-Therapeutic exercises, 97530- Therapeutic activity, [id number]- Neuromuscular re-education, 97535- Self Care, [id number]- Manual therapy, [id number]- Electrical stimulation (unattended), [id number]- Electrical stimulation (manual), [id number]- Ultrasound, [id number]- Traction (mechanical), [id number] (1-2  muscles), 20561 (3+ muscles)- Dry Needling, Patient/Family education, Taping, Joint mobilization, Joint manipulation, Spinal manipulation, Spinal  mobilization, Scar mobilization, Compression bandaging, Cryotherapy, Moist heat, and Biofeedback.  PLAN FOR NEXT SESSION:    Review HEP Thoracic mobility therex.  Manual for pain management  TDN for traps and parascapular muscles   Reyes LOISE London, PT 02/14/2024, 9:42 AM  "

## 2024-02-20 ENCOUNTER — Ambulatory Visit: Admitting: Physical Therapy

## 2024-02-20 DIAGNOSIS — M542 Cervicalgia: Secondary | ICD-10-CM

## 2024-02-20 DIAGNOSIS — R29898 Other symptoms and signs involving the musculoskeletal system: Secondary | ICD-10-CM

## 2024-02-20 DIAGNOSIS — M25619 Stiffness of unspecified shoulder, not elsewhere classified: Secondary | ICD-10-CM

## 2024-02-20 DIAGNOSIS — M549 Dorsalgia, unspecified: Secondary | ICD-10-CM

## 2024-02-20 NOTE — Therapy (Signed)
 " OUTPATIENT PHYSICAL THERAPY THORACOLUMBAR Treatment    Patient Name: Kathryn Santos MRN: [id number] DOB:02-12-1976, 49 y.o., female Today's Date: 02/20/2024  END OF SESSION:  PT End of Session - 02/20/24 0803     Visit Number 4    Number of Visits 16    Date for Recertification  03/19/24    Progress Note Due on Visit 10    PT Start Time 0805    PT Stop Time 0845    PT Time Calculation (min) 40 min    Activity Tolerance Patient tolerated treatment well    Behavior During Therapy [facility] for tasks assessed/performed           Past Medical History:  Diagnosis Date   Complication of anesthesia 2000   slow to awaken had to spend night   Family history of breast cancer    Family history of cervical cancer    Family history of colon cancer    Family history of lung cancer    Miscarriage 08/2022   Thyroid disease    TIA (transient ischemic attack) 2013   none since, thought to be related to birth control use at the time   Vaginal Pap smear, abnormal    Wears glasses    Past Surgical History:  Procedure Laterality Date   ABDOMINOPLASTY  2010   CHOLECYSTECTOMY     DILATION AND EVACUATION N/A 09/03/2022   Procedure: DILATATION AND EVACUATION;  Surgeon: Manuela Laveda BIRCH, MD;  Location:  SURGERY C[facility];  Service: Gynecology;  Laterality: N/A;   ENDOMETRIAL ABLATION  2015   FLUOROSCOPIC TUBAL RECANNULATUON     GALLBLADDER SURGERY     THYROIDECTOMY  02/2007   TUBAL LIGATION     Patient Active Problem List   Diagnosis Date Noted   Amenorrhea 11/30/2020   Encounter for screening mammogram for malignant neoplasm of breast 11/30/2020   Genetic testing 08/19/2020   Family history of colon cancer 08/08/2020   Family history of lung cancer 08/08/2020   Family history of breast cancer 08/08/2020   Family history of cervical cancer 08/08/2020   PTSD (post-traumatic stress disorder) 07/08/2020   Generalized anxiety disorder 07/08/2020   History of cholestasis during  pregnancy 03/21/2020   History of gestational diabetes mellitus 03/16/2020   History of pre-eclampsia 03/16/2020   Infertility, female 02/02/2020   Cervical os stenosis 07/02/2017   Class 1 obesity due to excess calories without serious comorbidity with body mass index (BMI) of 33.0 to 33.9 in adult 06/26/2017   History of reversal of tubal ligation 06/26/2017   Postoperative hypothyroidism 06/26/2017    PCP: Lang Dedra DASEN, MD   REFERRING PROVIDER: Waddell Leonce NOVAK, MD   REFERRING DIAG: N62 (ICD-10-CM) - Hypertrophy of breast   Rationale for Evaluation and Treatment: Rehabilitation  THERAPY DIAG:  Weakness of both arms  Upper back pain  Neck pain  Decreased range of motion of shoulder, unspecified laterality  Painful cervical ROM  ONSET DATE: > 6 months  SUBJECTIVE:  SUBJECTIVE STATEMENT: From today. 02/20/2024 Pt reports some immediate relief after last session that lasted for several days and overall feels like a significant relief once the soreness of TDN wore off.  Is completing HEP regularly.    From Evaluation:  Pt reports that she is in pain every single day. Reports that nothing has helped. Reports that she had 2 PT treatments last year for neck and back pain, but limited due to schedule conflicts.  States that she is still doing some stretching from prior PT treatments as well as performed Yoga and heat for pain management. States that OTC pain meds are not helping, so she has stopping taking them.   Would like to avoid surgery if possible.   PERTINENT HISTORY:  In 2013 pt reports TIA/mini stroke. States that she required 4-5 months of rehab services to regain  full LLE/LUE and speech function.   Was told by hospitalist that she may have DDD via imaging, but not confirmed in  documentation.   Upper Back pain has been present for > 6 months and is constant in nature. Feels like she is getting pulled forward constantly, increasing pain. States that she has been able to cope with pain through yoga, stretching, massage, but relief will last only for an hour at most.    PAIN:  Are you having pain? Yes: NPRS scale: 7/10 currently. 9/10 at worst.  Pain location: shoulders and upper back and shoulder blades . Mainly. Will occasionally radiates into the low back and L LE Pain description: tight achy uncomfortable..  Aggravating factors: prolonged sitting. Walking   Relieving factors: mild relief from lying down, stretching and some yoga positions.    PRECAUTIONS: None  RED FLAGS: None   WEIGHT BEARING RESTRICTIONS: No  FALLS:  Has patient fallen in last 6 months? No  LIVING ENVIRONMENT: Lives with: lives with their family Husband, 3 grown children, 1 teenager and 2 YO grandson.   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 Would like to   NEXT MD VISIT: Jan 26 with with Plastic Surgeon office.   OBJECTIVE:  Note: Objective measures were completed at Evaluation unless otherwise noted.  DIAGNOSTIC FINDINGS:  None recent relevant   PATIENT SURVEYS:  [place]estry Low Back Pain Disability Questionnaire: @OPRCOSWESTRYLBPDISABILITYQQ @  NDI:  NECK DISABILITY INDEX  Date: 01/23/2024  Score  Pain intensity 3 = The pain is fairly severe at the moment  2. Personal care (washing, dressing, etc.) 2 = It is painful to look after myself and I am slow and careful  3. Lifting 4 =  I can only lift very light weights  4. Reading 3 = I can't read as much as I want because of moderate pain in my neck  5. Headaches 1 =  I have slight headaches, which come  infrequently  6. Concentration 2 = I have a fair degree of difficulty in concentrating when I want to  7. Work 2 = I can do most of my usual work, but no more  8. Driving 3 = I can't drive my car as long as I want because of moderate pain in my neck  9. Sleeping 2 = My sleep is mildly disturbed (1-2 hrs sleepless)  10. Recreation 3 = I am able to engage in a few of my usual recreation activities because  of pain in   my neck  Total 25/50   Minimum Detectable Change (90% confidence): 5 points or 10% points  COGNITION: Overall cognitive status: Within functional limits for tasks assessed     SENSATION: Light touch: WFL, but reports that she will occasionally experience N/T in BUE    POSTURE: forward head and elevated shoulders.   PALPATION: Noted multiple TrP in UT, levator, rhomboids, middle traps, and parascapular mm  Noted reduced joint mobility in upper thoracic spine T6 and above as well as reduced cervical joint mobility.     Cervical ROM:   AROM eval  Flexion 50 *  Extension 50*  Right lateral flexion 30*  Left lateral flexion 40  Right rotation 75  Left rotation 57*   (Blank rows = not tested) *=pain  Thoracic  AROM eval  Flexion WFL *  Extension WFL*  Right lateral flexion 60  Left lateral flexion 55*  Right rotation 70  Left rotation 60*   (Blank rows = not tested) *=pain   UPPER EXTREMITY ROM:     Active  Right eval Left eval  Shoulder flexion 150 145*  Shoulder  extension    Shoulder abduction 160 160*  Shoulder adduction [facility]  Shoulder internal rotation  (reach behind back) L4 PSIS*  Hip external rotation  (reach behind head) WFL WFL   (Blank rows = not tested) *=pain  Upper  EXTREMITY MMT:    MMT Right eval Left eval  Shoulder flexion 4+ 4+  Shoulder extension 5 5  Shoulder  abduction 4+ 4  Shoulder  adduction 5 5  Shoulder internal rotation(reach back) 4+ 4+  Shoulder  external rotation 4 4  Elbow flexion 4+ 4+  Elbow extension  4+ 4+   (Blank rows = not tested)  GAIT: Distance walked: 13ft  Assistive device utilized: None Level of assistance: Complete Independence Comments: decreased shoulder rotation. Elevated scapula decreased step length   TREATMENT DATE: 02/20/2024    TE: SciFit at level 4- 2.5 min forward/2.5 min reverse. 30 sec rest break between bouts. No pain reported  Seated trunk extension and shoulder retraction with bolster along spine x 12 with 3 sec hold Seated trunk and cervical extension x over half bolster x 10 with 3 sec hold.  Seated scapular retraction and ER with YTB.   Sidelying open book x 12 bil with 3 sec hold, reduced ROM on the L compared to the R.     Manual therapy: -Prone extensive STM for cervical and thoracic paraspinals and then B UT/levator scap region.  Noted Several Taut bands along both scapular borders but more tightness associated with Left side.  Thoracic grade 3-4 CPA mobilizations x 10-15 sec per segment T2-T10.     Trigger Point Dry Needling  Initial Treatment: Pt instructed on Dry Needling rational, procedures, and possible side effects. Pt instructed to expect mild to moderate muscle soreness later in the day and/or into the next day.  Pt instructed in methods to reduce muscle soreness. Pt instructed to continue prescribed HEP. Because Dry Needling was performed over or adjacent to a lung field, pt was educated on S/S of pneumothorax and to seek immediate medical attention should they occur.  Patient was educated on signs and symptoms of infection and other risk factors and advised to seek medical attention should they occur.  Patient verbalized understanding of these instructions and education.   Patient Verbal Consent Given: Yes Education Handout Provided: Previously Provided Muscles Treated: B cervical paraspinals and then  B levator/Upper trap region Electrical Stimulation Performed: No Treatment Response/Outcome: Several local twitch responses noted  on each side (cervical paraspinals and levator/UT region). No immediate adverse reaction. Patient reports immediate relief after treatment.  Self care: Discussion of HEP and discussed      PATIENT EDUCATION:  Education details: benefits of PT. TDN. HEP provided as listed below Person educated: Patient Education method: Explanation, Demonstration, Verbal cues, and Handouts Education comprehension: verbalized understanding and returned demonstration  HOME EXERCISE PROGRAM: Access Code: [email or web address] URL: [email or web address] Date: 01/30/2024 Prepared by: Massie Dollar  Exercises - Seated Upper Trapezius Stretch  - 1 x daily - 7 x weekly - 3 sets - 4 reps - 20 seconds  hold - Seated Levator Scapulae Stretch  - 1 x daily - 7 x weekly - 3 sets - 4 reps - 20 seconds  hold - Low Trap Setting at Wall  - 1 x daily - 7 x weekly - 3 sets - 10 reps - 3 seconds hold - Supine Cervical Retraction with Towel  - 1 x daily - 7 x weekly - 3 sets - 10 reps - Supine Shoulder External Rotation with Resistance  - 1 x daily - 7 x weekly - 3 sets - 10 reps  ASSESSMENT:  CLINICAL IMPRESSION: Patient is a 49 y.o. female who was seen today for physical therapy treatment for upper back pain. Patient continues to presents with several areas of tightness addressed with extensive STM and later dry needling today. She responded well reporting some immediate relief after session and stating she felt like she could move her neck better. Increased thoracic mobility therex on this day in conjuction with manual therapy/TDN to reduce back and neck pain. Will continue to add more postural strengthening as appropriate for longer term pain relief.  Pt will benefit from skilled pt PT to address pain and ROM deficits to improve overall QoL and allow full return of function    OBJECTIVE IMPAIRMENTS: decreased ROM, decreased strength, hypomobility, increased fascial restrictions, increased muscle spasms, impaired  flexibility, impaired sensation, impaired UE functional use, improper body mechanics, postural dysfunction, and pain.   ACTIVITY LIMITATIONS: carrying, lifting, bending, sitting, standing, and dressing  PARTICIPATION LIMITATIONS: cleaning, laundry, driving, shopping, community activity, occupation, and yard work  PERSONAL FACTORS: Fitness, Past/current experiences, Sex, and Time since onset of injury/illness/exacerbation are also affecting patient's functional outcome.   REHAB POTENTIAL: Good  CLINICAL DECISION MAKING: Stable/uncomplicated  EVALUATION COMPLEXITY: Low   GOALS: Goals reviewed with patient? Yes   SHORT TERM GOALS: Target date: 02/27/2024    Patient will be independent in home exercise program to improve strength/mobility for better functional independence with ADLs. Baseline: initiated on 01/22/24 Goal status: INITIAL   LONG TERM GOALS: Target date: 03/19/2024    Patient will increase NDI  by 10% to demonstrate better functional mobility and better confidence with ADLs.  Baseline: 25/50 (50%) Goal status: INITIAL  2.  Patient will increased cervical Rotation to [facility] without pain to improve safety with driving and care of toddler grandson.  Baseline: pain and ROM deficits with R lateral flexion and L cervical rotation; see above.  Goal status: INITIAL  3.  Patient will increase shoulder IR ROM to reach bra strap to improve independence and function with ADLs/dressing tasks .  Baseline: limited to reach L4 on the R and L PSIS on the L  Goal status: INITIAL  4.  Patient will report pain 5/10 at worst and 2/10 at best to indicate improved fucntional and  tolerated with work and home tasks.  Baseline: 7/10 best. 9/10 worst.  Goal status: INITIAL   PLAN:  PT FREQUENCY: 1-2x/week  PT DURATION: 8 weeks  PLANNED INTERVENTIONS: 97164- PT Re-evaluation, 97750- Physical Performance Testing, 97110-Therapeutic exercises, 97530- Therapeutic activity, 97112-  Neuromuscular re-education, 97535- Self Care, 02859- Manual therapy, [id number]- Electrical stimulation (unattended), [phone]- Electrical stimulation (manual), [id number]- Ultrasound, 02987- Traction (mechanical), [phone] (1-2 muscles), 20561 (3+ muscles)- Dry Needling, Patient/Family education, Taping, Joint mobilization, Joint manipulation, Spinal manipulation, Spinal mobilization, Scar mobilization, Compression bandaging, Cryotherapy, Moist heat, and Biofeedback.  PLAN FOR NEXT SESSION:    Review HEP Thoracic mobility therex.  Manual for pain management  TDN for traps and parascapular muscles   Massie FORBES Dollar, PT 02/20/2024, 8:04 AM  "

## 2024-02-27 ENCOUNTER — Ambulatory Visit: Admitting: Physical Therapy

## 2024-02-27 DIAGNOSIS — R29898 Other symptoms and signs involving the musculoskeletal system: Secondary | ICD-10-CM

## 2024-02-27 DIAGNOSIS — M542 Cervicalgia: Secondary | ICD-10-CM

## 2024-02-27 DIAGNOSIS — M25619 Stiffness of unspecified shoulder, not elsewhere classified: Secondary | ICD-10-CM

## 2024-02-27 DIAGNOSIS — M549 Dorsalgia, unspecified: Secondary | ICD-10-CM

## 2024-02-27 NOTE — Therapy (Signed)
 " OUTPATIENT PHYSICAL THERAPY THORACOLUMBAR Treatment    Patient Name: Kathryn Santos MRN: [id number] DOB:Mar 17, 1975, 49 y.o., female Today's Date: 02/27/2024  END OF SESSION:  PT End of Session - 02/27/24 0807     Visit Number 5    Number of Visits 16    Date for Recertification  03/19/24    Progress Note Due on Visit 10    PT Start Time 0808    PT Stop Time 0845    PT Time Calculation (min) 37 min    Activity Tolerance Patient tolerated treatment well    Behavior During Therapy [facility] for tasks assessed/performed           Past Medical History:  Diagnosis Date   Complication of anesthesia 2000   slow to awaken had to spend night   Family history of breast cancer    Family history of cervical cancer    Family history of colon cancer    Family history of lung cancer    Miscarriage 08/2022   Thyroid disease    TIA (transient ischemic attack) 2013   none since, thought to be related to birth control use at the time   Vaginal Pap smear, abnormal    Wears glasses    Past Surgical History:  Procedure Laterality Date   ABDOMINOPLASTY  2010   CHOLECYSTECTOMY     DILATION AND EVACUATION N/A 09/03/2022   Procedure: DILATATION AND EVACUATION;  Surgeon: Manuela Laveda BIRCH, MD;  Location: [facility];  Service: Gynecology;  Laterality: N/A;   ENDOMETRIAL ABLATION  2015   FLUOROSCOPIC TUBAL RECANNULATUON     GALLBLADDER SURGERY     THYROIDECTOMY  02/2007   TUBAL LIGATION     Patient Active Problem List   Diagnosis Date Noted   Amenorrhea 11/30/2020   Encounter for screening mammogram for malignant neoplasm of breast 11/30/2020   Genetic testing 08/19/2020   Family history of colon cancer 08/08/2020   Family history of lung cancer 08/08/2020   Family history of breast cancer 08/08/2020   Family history of cervical cancer 08/08/2020   PTSD (post-traumatic stress disorder) 07/08/2020   Generalized anxiety disorder 07/08/2020   History of cholestasis during  pregnancy 03/21/2020   History of gestational diabetes mellitus 03/16/2020   History of pre-eclampsia 03/16/2020   Infertility, female 02/02/2020   Cervical os stenosis 07/02/2017   Class 1 obesity due to excess calories without serious comorbidity with body mass index (BMI) of 33.0 to 33.9 in adult 06/26/2017   History of reversal of tubal ligation 06/26/2017   Postoperative hypothyroidism 06/26/2017    PCP: Lang Dedra DASEN, MD   REFERRING PROVIDER: Waddell Leonce NOVAK, MD   REFERRING DIAG: N62 (ICD-10-CM) - Hypertrophy of breast   Rationale for Evaluation and Treatment: Rehabilitation  THERAPY DIAG:  Weakness of both arms  Upper back pain  Neck pain  Decreased range of motion of shoulder, unspecified laterality  Painful cervical ROM  ONSET DATE: > 6 months  SUBJECTIVE:  SUBJECTIVE STATEMENT: From today. 02/27/2024 Pt reports that it has been a rough week.  Reports that she is feeling much better in the shoulders, neck, and upper back. BUT states that she is now experiencing significant low back and sciatic pain the is radiating all the way into the R foot. Reports that she went to Urgent care and was given injection for low back pain management and muscle relaxer. Pt then states that the medication did severely irritate her stomach with significant nausea that has lasted since last Sunday, so she has hot taken it since then. There is still some low back pain but not radiating into lower leg on this day.  Reports that the L side of the shoulder and upper traps are still getting tight by the end of the day, but the R side of shoulders. Are almost back to normal  Pain reported at 5/10 in the upper back and lower back rated 5/10.     From Evaluation:  Pt reports that she is in pain every  single day. Reports that nothing has helped. Reports that she had 2 PT treatments last year for neck and back pain, but limited due to schedule conflicts.  States that she is still doing some stretching from prior PT treatments as well as performed Yoga and heat for pain management. States that OTC pain meds are not helping, so she has stopping taking them.   Would like to avoid surgery if possible.   PERTINENT HISTORY:  In 2013 pt reports TIA/mini stroke. States that she required 4-5 months of rehab services to regain  full LLE/LUE and speech function.   Was told by hospitalist that she may have DDD via imaging, but not confirmed in documentation.   Upper Back pain has been present for > 6 months and is constant in nature. Feels like she is getting pulled forward constantly, increasing pain. States that she has been able to cope with pain through yoga, stretching, massage, but relief will last only for an hour at most.    PAIN:  Are you having pain? Yes: NPRS scale: 7/10 currently. 9/10 at worst.  Pain location: shoulders and upper back and shoulder blades . Mainly. Will occasionally radiates into the low back and L LE Pain description: tight achy uncomfortable..  Aggravating factors: prolonged sitting. Walking   Relieving factors: mild relief from lying down, stretching and some yoga positions.    PRECAUTIONS: None  RED FLAGS: None   WEIGHT BEARING RESTRICTIONS: No  FALLS:  Has patient fallen in last 6 months? No  LIVING ENVIRONMENT: Lives with: lives with their family Husband, 3 grown children, 1 teenager and 2 YO grandson.   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 Would like to   NEXT MD VISIT: Jan 26 with with Plastic Surgeon office.   OBJECTIVE:  Note:  Objective measures were completed at Evaluation unless otherwise noted.  DIAGNOSTIC FINDINGS:  None recent relevant   PATIENT SURVEYS:  [place] Low Back Pain Disability Questionnaire: @OPRCOSWESTRYLBPDISABILITYQQ @  NDI:  NECK DISABILITY INDEX  Date: 01/23/2024  Score  Pain intensity 3 = The pain is fairly severe at the moment  2. Personal care (washing, dressing, etc.) 2 = It is painful to look after myself and I am slow and careful  3. Lifting 4 =  I can  only lift very light weights  4. Reading 3 = I can't read as much as I want because of moderate pain in my neck  5. Headaches 1 =  I have slight headaches, which come infrequently  6. Concentration 2 = I have a fair degree of difficulty in concentrating when I want to  7. Work 2 = I can do most of my usual work, but no more  8. Driving 3 = I can't drive my car as long as I want because of moderate pain in my neck  9. Sleeping 2 = My sleep is mildly disturbed (1-2 hrs sleepless)  10. Recreation 3 = I am able to engage in a few of my usual recreation activities because of pain in   my neck  Total 25/50   Minimum Detectable Change (90% confidence): 5 points or 10% points  COGNITION: Overall cognitive status: Within functional limits for tasks assessed     SENSATION: Light touch: WFL, but reports that she will occasionally experience N/T in BUE    POSTURE: forward head and elevated shoulders.   PALPATION: Noted multiple TrP in UT, levator, rhomboids, middle traps, and parascapular mm  Noted reduced joint mobility in upper thoracic spine T6 and above as well as reduced cervical joint mobility.     Cervical ROM:   AROM eval  Flexion 50 *  Extension 50*  Right lateral flexion 30*  Left lateral flexion 40  Right rotation 75  Left rotation 57*   (Blank rows = not tested) *=pain  Thoracic  AROM eval  Flexion WFL *  Extension WFL*  Right lateral flexion 60  Left lateral flexion 55*  Right rotation 70  Left rotation  60*   (Blank rows = not tested) *=pain   UPPER EXTREMITY ROM:     Active  Right eval Left eval  Shoulder flexion 150 145*  Shoulder  extension    Shoulder abduction 160 160*  Shoulder adduction [facility]  Shoulder internal rotation  (reach behind back) L4 PSIS*  Hip external rotation  (reach behind head) WFL WFL   (Blank rows = not tested) *=pain  Upper  EXTREMITY MMT:    MMT Right eval Left eval  Shoulder flexion 4+ 4+  Shoulder extension 5 5  Shoulder  abduction 4+ 4  Shoulder  adduction 5 5  Shoulder internal rotation(reach back) 4+ 4+  Shoulder  external rotation 4 4  Elbow flexion 4+ 4+  Elbow extension 4+ 4+   (Blank rows = not tested)  GAIT: Distance walked: 73ft  Assistive device utilized: None Level of assistance: Complete Independence Comments: decreased shoulder rotation. Elevated scapula decreased step length   TREATMENT DATE: 02/27/2024    TE: Therapy ball roll out forward x 10  Diagonal therapy ball roll out x 8 bil   Seated mid row x 20 2.5#  Seated high row/pull down x 15 2.5#  Single arm trunk rotation with mid row x 12bil   Modified qped shoulder raise with UE supported on EOM x 8 bil  Hip exetnsion in modified plank with UE support on EOM. X 5 bil.  Cues for improved core activation and to maintain neutral spine with hip extension and shoulder flexion.   Prone STM to UT, rhomboids, thoracic and lumber paraspinals.  Grade 2-3 UPA mobilizations T4- L3 30 sec per segment  Reports decreased tens    PATIENT EDUCATION:  Education details: benefits of PT. TDN. HEP provided as listed below Person educated: Patient Education method:  Explanation, Demonstration, Verbal cues, and Handouts Education comprehension: verbalized understanding and returned demonstration  HOME EXERCISE PROGRAM: Access Code: [email or web address] URL: [email or web address] Date: 01/30/2024 Prepared by: Massie Dollar  Exercises - Seated Upper Trapezius Stretch  - 1  x daily - 7 x weekly - 3 sets - 4 reps - 20 seconds  hold - Seated Levator Scapulae Stretch  - 1 x daily - 7 x weekly - 3 sets - 4 reps - 20 seconds  hold - Low Trap Setting at Wall  - 1 x daily - 7 x weekly - 3 sets - 10 reps - 3 seconds hold - Supine Cervical Retraction with Towel  - 1 x daily - 7 x weekly - 3 sets - 10 reps - Supine Shoulder External Rotation with Resistance  - 1 x daily - 7 x weekly - 3 sets - 10 reps  ASSESSMENT:  CLINICAL IMPRESSION: Patient is a 49 y.o. female who was seen today for physical therapy treatment for upper back pain. Patient continues to presents with several areas of tightness addressed with extensive STM including lumbar paraspinals on this day. HEP adjusted for improved shoulder girdle and core activation to address weakness and pain identified in last sessions.  Will continue to add more postural strengthening as appropriate for longer term pain relief.  Pt will benefit from skilled pt PT to address pain and ROM deficits to improve overall QoL and allow full return of function    OBJECTIVE IMPAIRMENTS: decreased ROM, decreased strength, hypomobility, increased fascial restrictions, increased muscle spasms, impaired flexibility, impaired sensation, impaired UE functional use, improper body mechanics, postural dysfunction, and pain.   ACTIVITY LIMITATIONS: carrying, lifting, bending, sitting, standing, and dressing  PARTICIPATION LIMITATIONS: cleaning, laundry, driving, shopping, community activity, occupation, and yard work  PERSONAL FACTORS: Fitness, Past/current experiences, Sex, and Time since onset of injury/illness/exacerbation are also affecting patient's functional outcome.   REHAB POTENTIAL: Good  CLINICAL DECISION MAKING: Stable/uncomplicated  EVALUATION COMPLEXITY: Low   GOALS: Goals reviewed with patient? Yes   SHORT TERM GOALS: Target date: 02/27/2024    Patient will be independent in home exercise program to improve  strength/mobility for better functional independence with ADLs. Baseline: initiated on 01/22/24 Goal status: INITIAL   LONG TERM GOALS: Target date: 03/19/2024    Patient will increase NDI  by 10% to demonstrate better functional mobility and better confidence with ADLs.  Baseline: 25/50 (50%) Goal status: INITIAL  2.  Patient will increased cervical Rotation to [facility] without pain to improve safety with driving and care of toddler grandson.  Baseline: pain and ROM deficits with R lateral flexion and L cervical rotation; see above.  Goal status: INITIAL  3.  Patient will increase shoulder IR ROM to reach bra strap to improve independence and function with ADLs/dressing tasks .  Baseline: limited to reach L4 on the R and L PSIS on the L  Goal status: INITIAL  4.  Patient will report pain 5/10 at worst and 2/10 at best to indicate improved fucntional and tolerated with work and home tasks.  Baseline: 7/10 best. 9/10 worst.  Goal status: INITIAL   PLAN:  PT FREQUENCY: 1-2x/week  PT DURATION: 8 weeks  PLANNED INTERVENTIONS: 97164- PT Re-evaluation, 97750- Physical Performance Testing, 97110-Therapeutic exercises, 97530- Therapeutic activity, [id number]- Neuromuscular re-education, 97535- Self Care, [id number]- Manual therapy, [id number]- Electrical stimulation (unattended), [id number]- Electrical stimulation (manual), [id number]- Ultrasound, [id number]- Traction (mechanical), [id number] (1-2 muscles), [id number] (3+ muscles)- Dry Needling, Patient/Family education, Taping, Joint mobilization, Joint manipulation, Spinal manipulation, Spinal  mobilization, Scar mobilization, Compression bandaging, Cryotherapy, Moist heat, and Biofeedback.  PLAN FOR NEXT SESSION:    Review HEP Thoracic mobility therex.  Manual for pain management  TDN for traps and parascapular muscles   Massie FORBES Dollar, PT 02/27/2024, 8:08 AM  "

## 2024-03-05 ENCOUNTER — Ambulatory Visit

## 2024-03-05 DIAGNOSIS — M542 Cervicalgia: Secondary | ICD-10-CM

## 2024-03-05 DIAGNOSIS — M549 Dorsalgia, unspecified: Secondary | ICD-10-CM

## 2024-03-05 DIAGNOSIS — R29898 Other symptoms and signs involving the musculoskeletal system: Secondary | ICD-10-CM | POA: Diagnosis not present

## 2024-03-05 DIAGNOSIS — M25619 Stiffness of unspecified shoulder, not elsewhere classified: Secondary | ICD-10-CM

## 2024-03-05 NOTE — Therapy (Signed)
 " OUTPATIENT PHYSICAL THERAPY THORACOLUMBAR Treatment    Patient Name: Kathryn Santos MRN: [id number] DOB:Dec 26, 1975, 49 y.o., female Today's Date: 03/05/2024  END OF SESSION:  PT End of Session - 03/05/24 0813     Visit Number 6    Number of Visits 16    Date for Recertification  03/19/24    Progress Note Due on Visit 10    PT Start Time 0810    PT Stop Time 0849    PT Time Calculation (min) 39 min    Activity Tolerance Patient tolerated treatment well    Behavior During Therapy [facility] for tasks assessed/performed            Past Medical History:  Diagnosis Date   Complication of anesthesia 2000   slow to awaken had to spend night   Family history of breast cancer    Family history of cervical cancer    Family history of colon cancer    Family history of lung cancer    Miscarriage 08/2022   Thyroid disease    TIA (transient ischemic attack) 2013   none since, thought to be related to birth control use at the time   Vaginal Pap smear, abnormal    Wears glasses    Past Surgical History:  Procedure Laterality Date   ABDOMINOPLASTY  2010   CHOLECYSTECTOMY     DILATION AND EVACUATION N/A 09/03/2022   Procedure: DILATATION AND EVACUATION;  Surgeon: Manuela Laveda BIRCH, MD;  Location: [facility];  Service: Gynecology;  Laterality: N/A;   ENDOMETRIAL ABLATION  2015   FLUOROSCOPIC TUBAL RECANNULATUON     GALLBLADDER SURGERY     THYROIDECTOMY  02/2007   TUBAL LIGATION     Patient Active Problem List   Diagnosis Date Noted   Amenorrhea 11/30/2020   Encounter for screening mammogram for malignant neoplasm of breast 11/30/2020   Genetic testing 08/19/2020   Family history of colon cancer 08/08/2020   Family history of lung cancer 08/08/2020   Family history of breast cancer 08/08/2020   Family history of cervical cancer 08/08/2020   PTSD (post-traumatic stress disorder) 07/08/2020   Generalized anxiety disorder 07/08/2020   History of cholestasis during  pregnancy 03/21/2020   History of gestational diabetes mellitus 03/16/2020   History of pre-eclampsia 03/16/2020   Infertility, female 02/02/2020   Cervical os stenosis 07/02/2017   Class 1 obesity due to excess calories without serious comorbidity with body mass index (BMI) of 33.0 to 33.9 in adult 06/26/2017   History of reversal of tubal ligation 06/26/2017   Postoperative hypothyroidism 06/26/2017    PCP: Lang Dedra DASEN, MD   REFERRING PROVIDER: Waddell Leonce NOVAK, MD   REFERRING DIAG: N62 (ICD-10-CM) - Hypertrophy of breast   Rationale for Evaluation and Treatment: Rehabilitation  THERAPY DIAG:  Weakness of both arms  Upper back pain  Neck pain  Decreased range of motion of shoulder, unspecified laterality  Painful cervical ROM  ONSET DATE: > 6 months  SUBJECTIVE:  SUBJECTIVE STATEMENT: From today. 03/05/2024 Patient arrived late today.    From Evaluation:  Pt reports that she is in pain every single day. Reports that nothing has helped. Reports that she had 2 PT treatments last year for neck and back pain, but limited due to schedule conflicts.  States that she is still doing some stretching from prior PT treatments as well as performed Yoga and heat for pain management. States that OTC pain meds are not helping, so she has stopping taking them.   Would like to avoid surgery if possible.   PERTINENT HISTORY:  In 2013 pt reports TIA/mini stroke. States that she required 4-5 months of rehab services to regain  full LLE/LUE and speech function.   Was told by hospitalist that she may have DDD via imaging, but not confirmed in documentation.   Upper Back pain has been present for > 6 months and is constant in nature. Feels like she is getting pulled forward constantly, increasing pain.  States that she has been able to cope with pain through yoga, stretching, massage, but relief will last only for an hour at most.    PAIN:  Are you having pain? Yes: NPRS scale: 7/10 currently. 9/10 at worst.  Pain location: shoulders and upper back and shoulder blades . Mainly. Will occasionally radiates into the low back and L LE Pain description: tight achy uncomfortable..  Aggravating factors: prolonged sitting. Walking   Relieving factors: mild relief from lying down, stretching and some yoga positions.    PRECAUTIONS: None  RED FLAGS: None   WEIGHT BEARING RESTRICTIONS: No  FALLS:  Has patient fallen in last 6 months? No  LIVING ENVIRONMENT: Lives with: lives with their family Husband, 3 grown children, 1 teenager and 2 YO grandson.   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 Would like to   NEXT MD VISIT: Jan 26 with with Plastic Surgeon office.   OBJECTIVE:  Note: Objective measures were completed at Evaluation unless otherwise noted.  DIAGNOSTIC FINDINGS:  None recent relevant   PATIENT SURVEYS:  [place] Low Back Pain Disability Questionnaire: @OPRCOSWESTRYLBPDISABILITYQQ @  NDI:  NECK DISABILITY INDEX  Date: 01/23/2024  Score  Pain intensity 3 = The pain is fairly severe at the moment  2. Personal care (washing, dressing, etc.) 2 = It is painful to look after myself and I am slow and careful  3. Lifting 4 =  I can only lift very light weights  4. Reading 3 = I can't read as much as I want because of moderate pain in my neck  5. Headaches 1 =  I have slight headaches, which come infrequently  6. Concentration 2 = I have a fair degree of difficulty in concentrating when I want to  7. Work 2 = I can do most of my usual work, but no more  8. Driving  3 = I can't drive my car as long as I want because of moderate pain in my neck  9. Sleeping 2 = My sleep is mildly disturbed (1-2 hrs sleepless)  10. Recreation 3 = I am able to engage in a few of my usual recreation activities because of pain in   my neck  Total 25/50   Minimum Detectable Change (90% confidence): 5 points or 10% points  COGNITION: Overall cognitive status: Within  functional limits for tasks assessed     SENSATION: Light touch: WFL, but reports that she will occasionally experience N/T in BUE    POSTURE: forward head and elevated shoulders.   PALPATION: Noted multiple TrP in UT, levator, rhomboids, middle traps, and parascapular mm  Noted reduced joint mobility in upper thoracic spine T6 and above as well as reduced cervical joint mobility.     Cervical ROM:   AROM eval  Flexion 50 *  Extension 50*  Right lateral flexion 30*  Left lateral flexion 40  Right rotation 75  Left rotation 57*   (Blank rows = not tested) *=pain  Thoracic  AROM eval  Flexion WFL *  Extension WFL*  Right lateral flexion 60  Left lateral flexion 55*  Right rotation 70  Left rotation 60*   (Blank rows = not tested) *=pain   UPPER EXTREMITY ROM:     Active  Right eval Left eval  Shoulder flexion 150 145*  Shoulder  extension    Shoulder abduction 160 160*  Shoulder adduction [facility]  Shoulder internal rotation  (reach behind back) L4 PSIS*  Hip external rotation  (reach behind head) WFL WFL   (Blank rows = not tested) *=pain  Upper  EXTREMITY MMT:    MMT Right eval Left eval  Shoulder flexion 4+ 4+  Shoulder extension 5 5  Shoulder  abduction 4+ 4  Shoulder  adduction 5 5  Shoulder internal rotation(reach back) 4+ 4+  Shoulder  external rotation 4 4  Elbow flexion 4+ 4+  Elbow extension 4+ 4+   (Blank rows = not tested)  GAIT: Distance walked: 4ft  Assistive device utilized: None Level of assistance: Complete Independence Comments: decreased  shoulder rotation. Elevated scapula decreased step length   TREATMENT DATE: 03/05/2024    TE: Sidelying thoracic opening open prayer stretch 10x each side with overpressure  Seated bringing elbow to table and abduct arm over head 8x each side  Manual:  Grade 2-3 UPA mobilizations T4- L3 30 sec per segment  J mobilization upper cervicothoracic spine x30 seconds x 3 trials    Trigger Point Dry Needling  Subsequent Treatment: Instructions provided previously at initial dry needling treatment.   Patient Verbal Consent Given: Yes Education Handout Provided: Previously Provided Muscles Treated: subscap, upper trap  Electrical Stimulation Performed: No Treatment Response/Outcome: No immediate adverse reaction. Patient reports immediate relief after treatment.      Self care: Discussion of HEP and discussed   PATIENT EDUCATION:  Education details: benefits of PT. TDN. HEP provided as listed below Person educated: Patient Education method: Explanation, Demonstration, Verbal cues, and Handouts Education comprehension: verbalized understanding and returned demonstration  HOME EXERCISE PROGRAM: Access Code: [email or web address] URL: [email or web address] Date: 01/30/2024 Prepared by: Massie Dollar  Exercises - Seated Upper Trapezius Stretch  - 1 x daily - 7 x weekly - 3 sets - 4 reps - 20 seconds  hold - Seated Levator Scapulae Stretch  - 1 x daily - 7 x weekly - 3 sets - 4 reps - 20 seconds  hold - Low Trap Setting at Wall  - 1 x daily - 7 x weekly - 3 sets - 10 reps - 3 seconds hold - Supine Cervical Retraction with Towel  - 1 x daily - 7 x weekly - 3 sets - 10 reps - Supine Shoulder External Rotation with Resistance  - 1 x daily - 7 x weekly - 3 sets - 10 reps  ASSESSMENT:  CLINICAL IMPRESSION:  The  patient arrives to the scheduled therapy session later than the originally planned start time, and as a direct result of this delayed arrival, the overall treatment session is  shortened and more abbreviated than what would typically occur during a full-length appointment. Due to the reduced available time, the session is necessarily condensed, though skilled interventions are still provided within the limited timeframe. Patient kept late to complete all interventions. She reports significant decrease of pain by end of session and improved mobility. Education on posture throughout session verbally was met well with patient verbalizing understanding.  Pt will benefit from skilled pt PT to address pain and ROM deficits to improve overall QoL and allow full return of function    OBJECTIVE IMPAIRMENTS: decreased ROM, decreased strength, hypomobility, increased fascial restrictions, increased muscle spasms, impaired flexibility, impaired sensation, impaired UE functional use, improper body mechanics, postural dysfunction, and pain.   ACTIVITY LIMITATIONS: carrying, lifting, bending, sitting, standing, and dressing  PARTICIPATION LIMITATIONS: cleaning, laundry, driving, shopping, community activity, occupation, and yard work  PERSONAL FACTORS: Fitness, Past/current experiences, Sex, and Time since onset of injury/illness/exacerbation are also affecting patient's functional outcome.   REHAB POTENTIAL: Good  CLINICAL DECISION MAKING: Stable/uncomplicated  EVALUATION COMPLEXITY: Low   GOALS: Goals reviewed with patient? Yes   SHORT TERM GOALS: Target date: 02/27/2024    Patient will be independent in home exercise program to improve strength/mobility for better functional independence with ADLs. Baseline: initiated on 01/22/24 Goal status: INITIAL   LONG TERM GOALS: Target date: 03/19/2024    Patient will increase NDI  by 10% to demonstrate better functional mobility and better confidence with ADLs.  Baseline: 25/50 (50%) Goal status: INITIAL  2.  Patient will increased cervical Rotation to [facility] without pain to improve safety with driving and care of toddler  grandson.  Baseline: pain and ROM deficits with R lateral flexion and L cervical rotation; see above.  Goal status: INITIAL  3.  Patient will increase shoulder IR ROM to reach bra strap to improve independence and function with ADLs/dressing tasks .  Baseline: limited to reach L4 on the R and L PSIS on the L  Goal status: INITIAL  4.  Patient will report pain 5/10 at worst and 2/10 at best to indicate improved fucntional and tolerated with work and home tasks.  Baseline: 7/10 best. 9/10 worst.  Goal status: INITIAL   PLAN:  PT FREQUENCY: 1-2x/week  PT DURATION: 8 weeks  PLANNED INTERVENTIONS: 97164- PT Re-evaluation, 97750- Physical Performance Testing, 97110-Therapeutic exercises, 97530- Therapeutic activity, 97112- Neuromuscular re-education, 97535- Self Care, [id number]- Manual therapy, [id number]- Electrical stimulation (unattended), [phone]- Electrical stimulation (manual), [id number]- Ultrasound, 02987- Traction (mechanical), [phone] (1-2 muscles), [id number] (3+ muscles)- Dry Needling, Patient/Family education, Taping, Joint mobilization, Joint manipulation, Spinal manipulation, Spinal mobilization, Scar mobilization, Compression bandaging, Cryotherapy, Moist heat, and Biofeedback.  PLAN FOR NEXT SESSION:    Review HEP Thoracic mobility therex.  Manual for pain management  TDN for traps and parascapular muscles   Guilianna Mckoy, PT 03/05/2024, 9:20 AM  "

## 2024-03-09 ENCOUNTER — Ambulatory Visit: Admitting: Plastic Surgery

## 2024-03-11 NOTE — Therapy (Signed)
 " OUTPATIENT PHYSICAL THERAPY THORACOLUMBAR Treatment    Patient Name: Kathryn Santos MRN: [id number] DOB:1975/06/14, 49 y.o., female Today's Date: 03/12/2024  END OF SESSION:  PT End of Session - 03/12/24 0805     Visit Number 7    Number of Visits 16    Date for Recertification  03/19/24    Progress Note Due on Visit 10    PT Start Time 0804    PT Stop Time 0844    PT Time Calculation (min) 40 min    Activity Tolerance Patient tolerated treatment well    Behavior During Therapy [facility] for tasks assessed/performed             Past Medical History:  Diagnosis Date   Complication of anesthesia 2000   slow to awaken had to spend night   Family history of breast cancer    Family history of cervical cancer    Family history of colon cancer    Family history of lung cancer    Miscarriage 08/2022   Thyroid disease    TIA (transient ischemic attack) 2013   none since, thought to be related to birth control use at the time   Vaginal Pap smear, abnormal    Wears glasses    Past Surgical History:  Procedure Laterality Date   ABDOMINOPLASTY  2010   CHOLECYSTECTOMY     DILATION AND EVACUATION N/A 09/03/2022   Procedure: DILATATION AND EVACUATION;  Surgeon: Manuela Laveda BIRCH, MD;  Location: [facility];  Service: Gynecology;  Laterality: N/A;   ENDOMETRIAL ABLATION  2015   FLUOROSCOPIC TUBAL RECANNULATUON     GALLBLADDER SURGERY     THYROIDECTOMY  02/2007   TUBAL LIGATION     Patient Active Problem List   Diagnosis Date Noted   Amenorrhea 11/30/2020   Encounter for screening mammogram for malignant neoplasm of breast 11/30/2020   Genetic testing 08/19/2020   Family history of colon cancer 08/08/2020   Family history of lung cancer 08/08/2020   Family history of breast cancer 08/08/2020   Family history of cervical cancer 08/08/2020   PTSD (post-traumatic stress disorder) 07/08/2020   Generalized anxiety disorder 07/08/2020   History of cholestasis during  pregnancy 03/21/2020   History of gestational diabetes mellitus 03/16/2020   History of pre-eclampsia 03/16/2020   Infertility, female 02/02/2020   Cervical os stenosis 07/02/2017   Class 1 obesity due to excess calories without serious comorbidity with body mass index (BMI) of 33.0 to 33.9 in adult 06/26/2017   History of reversal of tubal ligation 06/26/2017   Postoperative hypothyroidism 06/26/2017    PCP: Lang Dedra DASEN, MD   REFERRING PROVIDER: Waddell Leonce NOVAK, MD   REFERRING DIAG: N62 (ICD-10-CM) - Hypertrophy of breast   Rationale for Evaluation and Treatment: Rehabilitation  THERAPY DIAG:  Weakness of both arms  Upper back pain  Neck pain  Decreased range of motion of shoulder, unspecified laterality  ONSET DATE: > 6 months  SUBJECTIVE:  SUBJECTIVE STATEMENT: From today. 03/12/2024 Patient notices improvement after each session, still has pain by end of day.    From Evaluation:  Pt reports that she is in pain every single day. Reports that nothing has helped. Reports that she had 2 PT treatments last year for neck and back pain, but limited due to schedule conflicts.  States that she is still doing some stretching from prior PT treatments as well as performed Yoga and heat for pain management. States that OTC pain meds are not helping, so she has stopping taking them.   Would like to avoid surgery if possible.   PERTINENT HISTORY:  In 2013 pt reports TIA/mini stroke. States that she required 4-5 months of rehab services to regain  full LLE/LUE and speech function.   Was told by hospitalist that she may have DDD via imaging, but not confirmed in documentation.   Upper Back pain has been present for > 6 months and is constant in nature. Feels like she is getting pulled forward  constantly, increasing pain. States that she has been able to cope with pain through yoga, stretching, massage, but relief will last only for an hour at most.    PAIN:  Are you having pain? Yes: NPRS scale: 7/10 currently. 9/10 at worst.  Pain location: shoulders and upper back and shoulder blades . Mainly. Will occasionally radiates into the low back and L LE Pain description: tight achy uncomfortable..  Aggravating factors: prolonged sitting. Walking   Relieving factors: mild relief from lying down, stretching and some yoga positions.    PRECAUTIONS: None  RED FLAGS: None   WEIGHT BEARING RESTRICTIONS: No  FALLS:  Has patient fallen in last 6 months? No  LIVING ENVIRONMENT: Lives with: lives with their family Husband, 3 grown children, 1 teenager and 2 YO grandson.   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 Would like to   NEXT MD VISIT: Jan 26 with with Plastic Surgeon office.   OBJECTIVE:  Note: Objective measures were completed at Evaluation unless otherwise noted.  DIAGNOSTIC FINDINGS:  None recent relevant   PATIENT SURVEYS:  [place] Low Back Pain Disability Questionnaire: @OPRCOSWESTRYLBPDISABILITYQQ @  NDI:  NECK DISABILITY INDEX  Date: 01/23/2024  Score  Pain intensity 3 = The pain is fairly severe at the moment  2. Personal care (washing, dressing, etc.) 2 = It is painful to look after myself and I am slow and careful  3. Lifting 4 =  I can only lift very light weights  4. Reading 3 = I can't read as much as I want because of moderate pain in my neck  5. Headaches 1 =  I have slight headaches, which come infrequently  6. Concentration 2 = I have a fair degree of difficulty in concentrating when I want to  7. Work 2 = I can do most of my usual  work, but no more  8. Driving 3 = I can't drive my car as long as I want because of moderate pain in my neck  9. Sleeping 2 = My sleep is mildly disturbed (1-2 hrs sleepless)  10. Recreation 3 = I am able to engage in a few of my usual recreation activities because of pain in   my neck  Total 25/50   Minimum Detectable Change (90% confidence): 5 points  or 10% points  COGNITION: Overall cognitive status: Within functional limits for tasks assessed     SENSATION: Light touch: WFL, but reports that she will occasionally experience N/T in BUE    POSTURE: forward head and elevated shoulders.   PALPATION: Noted multiple TrP in UT, levator, rhomboids, middle traps, and parascapular mm  Noted reduced joint mobility in upper thoracic spine T6 and above as well as reduced cervical joint mobility.     Cervical ROM:   AROM eval  Flexion 50 *  Extension 50*  Right lateral flexion 30*  Left lateral flexion 40  Right rotation 75  Left rotation 57*   (Blank rows = not tested) *=pain  Thoracic  AROM eval  Flexion WFL *  Extension WFL*  Right lateral flexion 60  Left lateral flexion 55*  Right rotation 70  Left rotation 60*   (Blank rows = not tested) *=pain   UPPER EXTREMITY ROM:     Active  Right eval Left eval  Shoulder flexion 150 145*  Shoulder  extension    Shoulder abduction 160 160*  Shoulder adduction [facility]  Shoulder internal rotation  (reach behind back) L4 PSIS*  Hip external rotation  (reach behind head) WFL WFL   (Blank rows = not tested) *=pain  Upper  EXTREMITY MMT:    MMT Right eval Left eval  Shoulder flexion 4+ 4+  Shoulder extension 5 5  Shoulder  abduction 4+ 4  Shoulder  adduction 5 5  Shoulder internal rotation(reach back) 4+ 4+  Shoulder  external rotation 4 4  Elbow flexion 4+ 4+  Elbow extension 4+ 4+   (Blank rows = not tested)  GAIT: Distance walked: 14ft  Assistive device utilized: None Level of assistance: Complete  Independence Comments: decreased shoulder rotation. Elevated scapula decreased step length   TREATMENT DATE: 03/12/2024    TE: Seated GTB row 15x; x 2 sets  On half foam roller: -robber stretch 3x30 seconds -GTB overhead Y 10x; 2 sets -GTB ER 15x x2 sets   Manual:  Grade 2-3 UPA mobilizations T4- L3 30 sec per segment  J mobilization upper cervicothoracic spine x30 seconds x 3 trials    Trigger Point Dry Needling  Subsequent Treatment: Instructions provided previously at initial dry needling treatment.   Patient Verbal Consent Given: Yes Education Handout Provided: Previously Provided Muscles Treated: subscap, upper trap  Electrical Stimulation Performed: No Treatment Response/Outcome: No immediate adverse reaction. Patient reports immediate relief after treatment.      Self care: Discussion of HEP and discussed   PATIENT EDUCATION:  Education details: benefits of PT. TDN. HEP provided as listed below Person educated: Patient Education method: Explanation, Demonstration, Verbal cues, and Handouts Education comprehension: verbalized understanding and returned demonstration  HOME EXERCISE PROGRAM: Access Code: [email or web address] URL: [email or web address] Date: 01/30/2024 Prepared by: Massie Dollar  Exercises - Seated Upper Trapezius Stretch  - 1 x daily - 7 x weekly - 3 sets - 4 reps - 20 seconds  hold - Seated Levator Scapulae Stretch  - 1 x daily - 7 x weekly - 3 sets - 4 reps - 20 seconds  hold - Low Trap Setting at Wall  - 1 x daily - 7 x weekly - 3 sets - 10 reps - 3 seconds hold - Supine Cervical Retraction with Towel  - 1 x daily - 7 x weekly - 3 sets - 10 reps - Supine Shoulder External Rotation with Resistance  - 1 x daily - 7 x weekly -  3 sets - 10 reps  ASSESSMENT:  CLINICAL IMPRESSION:  Patient tolerates progressive strengthening and postural exercises. Introduction to half foam roller tolerated well with no pain increase. Significant trigger  points released with TDN with patient reporting relief of symptoms by end of session. Patient has decreased elevation of shoulders bilaterally by end of session and improved scapular retraction.  Pt will benefit from skilled pt PT to address pain and ROM deficits to improve overall QoL and allow full return of function    OBJECTIVE IMPAIRMENTS: decreased ROM, decreased strength, hypomobility, increased fascial restrictions, increased muscle spasms, impaired flexibility, impaired sensation, impaired UE functional use, improper body mechanics, postural dysfunction, and pain.   ACTIVITY LIMITATIONS: carrying, lifting, bending, sitting, standing, and dressing  PARTICIPATION LIMITATIONS: cleaning, laundry, driving, shopping, community activity, occupation, and yard work  PERSONAL FACTORS: Fitness, Past/current experiences, Sex, and Time since onset of injury/illness/exacerbation are also affecting patient's functional outcome.   REHAB POTENTIAL: Good  CLINICAL DECISION MAKING: Stable/uncomplicated  EVALUATION COMPLEXITY: Low   GOALS: Goals reviewed with patient? Yes   SHORT TERM GOALS: Target date: 02/27/2024    Patient will be independent in home exercise program to improve strength/mobility for better functional independence with ADLs. Baseline: initiated on 01/22/24 Goal status: INITIAL   LONG TERM GOALS: Target date: 03/19/2024    Patient will increase NDI  by 10% to demonstrate better functional mobility and better confidence with ADLs.  Baseline: 25/50 (50%) Goal status: INITIAL  2.  Patient will increased cervical Rotation to [facility] without pain to improve safety with driving and care of toddler grandson.  Baseline: pain and ROM deficits with R lateral flexion and L cervical rotation; see above.  Goal status: INITIAL  3.  Patient will increase shoulder IR ROM to reach bra strap to improve independence and function with ADLs/dressing tasks .  Baseline: limited to reach L4 on  the R and L PSIS on the L  Goal status: INITIAL  4.  Patient will report pain 5/10 at worst and 2/10 at best to indicate improved fucntional and tolerated with work and home tasks.  Baseline: 7/10 best. 9/10 worst.  Goal status: INITIAL   PLAN:  PT FREQUENCY: 1-2x/week  PT DURATION: 8 weeks  PLANNED INTERVENTIONS: 97164- PT Re-evaluation, 97750- Physical Performance Testing, 97110-Therapeutic exercises, 97530- Therapeutic activity, 97112- Neuromuscular re-education, 97535- Self Care, 02859- Manual therapy, [id number]- Electrical stimulation (unattended), [id number]- Electrical stimulation (manual), [id number]- Ultrasound, 02987- Traction (mechanical), [phone] (1-2 muscles), [id number] (3+ muscles)- Dry Needling, Patient/Family education, Taping, Joint mobilization, Joint manipulation, Spinal manipulation, Spinal mobilization, Scar mobilization, Compression bandaging, Cryotherapy, Moist heat, and Biofeedback.  PLAN FOR NEXT SESSION:    Review HEP Thoracic mobility therex.  Manual for pain management  TDN for traps and parascapular muscles   Gabriela Irigoyen, PT 03/12/2024, 9:25 AM  "

## 2024-03-12 ENCOUNTER — Ambulatory Visit

## 2024-03-12 DIAGNOSIS — M25619 Stiffness of unspecified shoulder, not elsewhere classified: Secondary | ICD-10-CM

## 2024-03-12 DIAGNOSIS — M542 Cervicalgia: Secondary | ICD-10-CM

## 2024-03-12 DIAGNOSIS — R29898 Other symptoms and signs involving the musculoskeletal system: Secondary | ICD-10-CM | POA: Diagnosis not present

## 2024-03-12 DIAGNOSIS — M549 Dorsalgia, unspecified: Secondary | ICD-10-CM

## 2024-03-19 ENCOUNTER — Ambulatory Visit

## 2024-03-19 DIAGNOSIS — M542 Cervicalgia: Secondary | ICD-10-CM

## 2024-03-19 DIAGNOSIS — M549 Dorsalgia, unspecified: Secondary | ICD-10-CM

## 2024-03-19 DIAGNOSIS — R29898 Other symptoms and signs involving the musculoskeletal system: Secondary | ICD-10-CM

## 2024-03-19 DIAGNOSIS — M25619 Stiffness of unspecified shoulder, not elsewhere classified: Secondary | ICD-10-CM

## 2024-03-19 NOTE — Therapy (Signed)
 " OUTPATIENT PHYSICAL THERAPY THORACOLUMBAR Treatment /RECERT   Patient Name: Kathryn Santos MRN: [id number] DOB:1975/10/19, 49 y.o., female Today's Date: 03/19/2024  END OF SESSION:  PT End of Session - 03/19/24 0900     Visit Number 8    Number of Visits 12    Date for Recertification  04/16/24    Progress Note Due on Visit 10    PT Start Time 0806    PT Stop Time 0847    PT Time Calculation (min) 41 min    Activity Tolerance Patient tolerated treatment well    Behavior During Therapy [facility] for tasks assessed/performed              Past Medical History:  Diagnosis Date   Complication of anesthesia 2000   slow to awaken had to spend night   Family history of breast cancer    Family history of cervical cancer    Family history of colon cancer    Family history of lung cancer    Miscarriage 08/2022   Thyroid disease    TIA (transient ischemic attack) 2013   none since, thought to be related to birth control use at the time   Vaginal Pap smear, abnormal    Wears glasses    Past Surgical History:  Procedure Laterality Date   ABDOMINOPLASTY  2010   CHOLECYSTECTOMY     DILATION AND EVACUATION N/A 09/03/2022   Procedure: DILATATION AND EVACUATION;  Surgeon: Manuela Laveda BIRCH, MD;  Location: [facility];  Service: Gynecology;  Laterality: N/A;   ENDOMETRIAL ABLATION  2015   FLUOROSCOPIC TUBAL RECANNULATUON     GALLBLADDER SURGERY     THYROIDECTOMY  02/2007   TUBAL LIGATION     Patient Active Problem List   Diagnosis Date Noted   Amenorrhea 11/30/2020   Encounter for screening mammogram for malignant neoplasm of breast 11/30/2020   Genetic testing 08/19/2020   Family history of colon cancer 08/08/2020   Family history of lung cancer 08/08/2020   Family history of breast cancer 08/08/2020   Family history of cervical cancer 08/08/2020   PTSD (post-traumatic stress disorder) 07/08/2020   Generalized anxiety disorder 07/08/2020   History of cholestasis  during pregnancy 03/21/2020   History of gestational diabetes mellitus 03/16/2020   History of pre-eclampsia 03/16/2020   Infertility, female 02/02/2020   Cervical os stenosis 07/02/2017   Class 1 obesity due to excess calories without serious comorbidity with body mass index (BMI) of 33.0 to 33.9 in adult 06/26/2017   History of reversal of tubal ligation 06/26/2017   Postoperative hypothyroidism 06/26/2017    PCP: Lang Dedra DASEN, MD   REFERRING PROVIDER: Waddell Leonce NOVAK, MD   REFERRING DIAG: N62 (ICD-10-CM) - Hypertrophy of breast   Rationale for Evaluation and Treatment: Rehabilitation  THERAPY DIAG:  Weakness of both arms  Upper back pain  Neck pain  Decreased range of motion of shoulder, unspecified laterality  Painful cervical ROM  ONSET DATE: > 6 months  SUBJECTIVE:  SUBJECTIVE STATEMENT: From today. 03/19/2024 Patient reports she is significantly better does continue to have some L sided discomfort, especially after being on her feet for prolonged period of time.  Since she was here last she had two days she had to use a heating pad, which is an improvement from having to use it multiple times a day every day.  The lower back pain is also better, is not avoiding activities now.    From Evaluation:  Pt reports that she is in pain every single day. Reports that nothing has helped. Reports that she had 2 PT treatments last year for neck and back pain, but limited due to schedule conflicts.  States that she is still doing some stretching from prior PT treatments as well as performed Yoga and heat for pain management. States that OTC pain meds are not helping, so she has stopping taking them.   Would like to avoid surgery if possible.   PERTINENT HISTORY:  In 2013 pt reports  TIA/mini stroke. States that she required 4-5 months of rehab services to regain  full LLE/LUE and speech function.   Was told by hospitalist that she may have DDD via imaging, but not confirmed in documentation.   Upper Back pain has been present for > 6 months and is constant in nature. Feels like she is getting pulled forward constantly, increasing pain. States that she has been able to cope with pain through yoga, stretching, massage, but relief will last only for an hour at most.    PAIN:  Are you having pain? Yes: NPRS scale: 7/10 currently. 9/10 at worst.  Pain location: shoulders and upper back and shoulder blades . Mainly. Will occasionally radiates into the low back and L LE Pain description: tight achy uncomfortable..  Aggravating factors: prolonged sitting. Walking   Relieving factors: mild relief from lying down, stretching and some yoga positions.    PRECAUTIONS: None  RED FLAGS: None   WEIGHT BEARING RESTRICTIONS: No  FALLS:  Has patient fallen in last 6 months? No  LIVING ENVIRONMENT: Lives with: lives with their family Husband, 3 grown children, 1 teenager and 2 YO grandson.   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 Would like to   NEXT MD VISIT: Jan 26 with with Plastic Surgeon office.   OBJECTIVE:  Note: Objective measures were completed at Evaluation unless otherwise noted.  DIAGNOSTIC FINDINGS:  None recent relevant   PATIENT SURVEYS:  [place] Low Back Pain Disability Questionnaire: @OPRCOSWESTRYLBPDISABILITYQQ @  NDI:  NECK DISABILITY INDEX  Date: 01/23/2024  Score  Pain intensity 3 = The pain is fairly severe at the moment  2. Personal care (washing, dressing, etc.) 2 = It is painful to look after myself and I am slow and careful  3.  Lifting 4 =  I can only lift very light weights  4. Reading 3 = I can't read as much as I want because of moderate pain in my neck  5. Headaches 1 =  I have slight headaches, which come infrequently  6. Concentration 2 = I have a fair degree of difficulty in concentrating when I want to  7. Work 2 = I can do most of my usual work, but no more  8. Driving 3 = I can't drive my car as long as I want because  of moderate pain in my neck  9. Sleeping 2 = My sleep is mildly disturbed (1-2 hrs sleepless)  10. Recreation 3 = I am able to engage in a few of my usual recreation activities because of pain in   my neck  Total 25/50   Minimum Detectable Change (90% confidence): 5 points or 10% points  COGNITION: Overall cognitive status: Within functional limits for tasks assessed     SENSATION: Light touch: WFL, but reports that she will occasionally experience N/T in BUE    POSTURE: forward head and elevated shoulders.   PALPATION: Noted multiple TrP in UT, levator, rhomboids, middle traps, and parascapular mm  Noted reduced joint mobility in upper thoracic spine T6 and above as well as reduced cervical joint mobility.     Cervical ROM:   AROM eval  Flexion 50 *  Extension 50*  Right lateral flexion 30*  Left lateral flexion 40  Right rotation 75  Left rotation 57*   (Blank rows = not tested) *=pain  Thoracic  AROM eval  Flexion WFL *  Extension WFL*  Right lateral flexion 60  Left lateral flexion 55*  Right rotation 70  Left rotation 60*   (Blank rows = not tested) *=pain   UPPER EXTREMITY ROM:     Active  Right eval Left eval  Shoulder flexion 150 145*  Shoulder  extension    Shoulder abduction 160 160*  Shoulder adduction [facility]  Shoulder internal rotation  (reach behind back) L4 PSIS*  Hip external rotation  (reach behind head) WFL WFL   (Blank rows = not tested) *=pain  Upper  EXTREMITY MMT:    MMT Right eval Left eval  Shoulder flexion 4+ 4+  Shoulder  extension 5 5  Shoulder  abduction 4+ 4  Shoulder  adduction 5 5  Shoulder internal rotation(reach back) 4+ 4+  Shoulder  external rotation 4 4  Elbow flexion 4+ 4+  Elbow extension 4+ 4+   (Blank rows = not tested)  GAIT: Distance walked: 47ft  Assistive device utilized: None Level of assistance: Complete Independence Comments: decreased shoulder rotation. Elevated scapula decreased step length   TREATMENT DATE: 03/19/2024 Physical therapy treatment session today consisted of completing assessment of goals and administration of testing as demonstrated and documented in flow sheet, treatment, and goals section of this note. Addition treatments may be found below.      Right Left  Flexion 65  Extension 55  Side Bending 55 70  Rotation 60 55   Manual:  Grade 2-3 UPA mobilizations T4- L3 30 sec per segment  J mobilization upper cervicothoracic spine x30 seconds x 3 trials    Trigger Point Dry Needling  Subsequent Treatment: Instructions provided previously at initial dry needling treatment.   Patient Verbal Consent Given: Yes Education Handout Provided: Previously Provided Muscles Treated: subscap, upper trap  Electrical Stimulation Performed: No Treatment Response/Outcome: No immediate adverse reaction. Patient reports immediate relief after treatment.      Self care: Discussion of HEP and discussed   PATIENT EDUCATION:  Education details: benefits of PT. TDN. HEP provided as listed below Person educated: Patient Education method: Explanation, Demonstration, Verbal cues, and Handouts Education comprehension: verbalized understanding and returned demonstration  HOME EXERCISE PROGRAM: Access Code: [email or web address] URL: [email or web address] Date: 01/30/2024 Prepared by: Massie Dollar  Exercises - Seated Upper Trapezius Stretch  - 1 x daily - 7 x weekly - 3 sets - 4 reps - 20 seconds  hold - Seated Levator Scapulae Stretch  -  1 x daily - 7 x weekly - 3 sets  - 4 reps - 20 seconds  hold - Low Trap Setting at Wall  - 1 x daily - 7 x weekly - 3 sets - 10 reps - 3 seconds hold - Supine Cervical Retraction with Towel  - 1 x daily - 7 x weekly - 3 sets - 10 reps - Supine Shoulder External Rotation with Resistance  - 1 x daily - 7 x weekly - 3 sets - 10 reps  ASSESSMENT:  CLINICAL IMPRESSION: The patient is a 49 year old female presenting for recertification who has made significant progress toward both short term and long term goals. She has met independence with her home exercise program, demonstrated marked improvement in functional mobility as evidenced by a reduction in NDI from 50 percent to 14 percent, and achieved full functional shoulder internal rotation bilaterally, allowing improved independence with dressing and activities of daily living. Pain levels have improved overall, with best pain reduced to 2 out of 10 and worst pain reduced to 6 out of 10, indicating meaningful functional gains, though pain goals are not yet fully met. Cervical mobility has improved, with remaining deficits in rotation and side bending that continue to impact functional activities such as driving and caring for her toddler grandson. The patient continues to respond positively to skilled physical therapy interventions including joint mobilization and trigger point dry needling, reporting immediate symptom relief without adverse effects. Continued physical therapy for an additional month is recommended to address residual cervical mobility limitations, further reduce pain, and progress toward full goal achievement. Ongoing skilled care will support maximal functional recovery, improved safety with daily tasks, and long term symptom management.  OBJECTIVE IMPAIRMENTS: decreased ROM, decreased strength, hypomobility, increased fascial restrictions, increased muscle spasms, impaired flexibility, impaired sensation, impaired UE functional use, improper body mechanics, postural  dysfunction, and pain.   ACTIVITY LIMITATIONS: carrying, lifting, bending, sitting, standing, and dressing  PARTICIPATION LIMITATIONS: cleaning, laundry, driving, shopping, community activity, occupation, and yard work  PERSONAL FACTORS: Fitness, Past/current experiences, Sex, and Time since onset of injury/illness/exacerbation are also affecting patient's functional outcome.   REHAB POTENTIAL: Good  CLINICAL DECISION MAKING: Stable/uncomplicated  EVALUATION COMPLEXITY: Low   GOALS: Goals reviewed with patient? Yes   SHORT TERM GOALS: Target date: 04/02/2024      Patient will be independent in home exercise program to improve strength/mobility for better functional independence with ADLs. Baseline: initiated on 01/22/24 Goal status: MET   LONG TERM GOALS: Target date: 04/16/2024   Patient will increase NDI  by 10% to demonstrate better functional mobility and better confidence with ADLs.  Baseline: 25/50 (50%)  2/5: 7/50: 14% Goal status: MET  2.  Patient will increased cervical Rotation to [facility] without pain to improve safety with driving and care of toddler grandson.  Baseline: pain and ROM deficits with R lateral flexion and L cervical rotation; see above.  2/5: see above Goal status: in progress   3.  Patient will increase shoulder IR ROM to reach bra strap to improve independence and function with ADLs/dressing tasks .  Baseline: limited to reach L4 on the R and L PSIS on the L  2/5: reach bra strap bilaterally  Goal status: MET  4.  Patient will report pain 5/10 at worst and 2/10 at best to indicate improved fucntional and tolerated with work and home tasks.  Baseline: 7/10 best. 9/10 worst.  2/5: 6/10 worst pain, least/ best pain: 2/10  Goal status: Partially  Met   PLAN:  PT FREQUENCY: 1-2x/week  PT DURATION: 8 weeks, last cert for 4 weeks  PLANNED INTERVENTIONS: 97164- PT Re-evaluation, 97750- Physical Performance Testing, 97110-Therapeutic exercises,  97530- Therapeutic activity, 97112- Neuromuscular re-education, 97535- Self Care, [id number]- Manual therapy, [id number]- Electrical stimulation (unattended), [phone]- Electrical stimulation (manual), [id number]- Ultrasound, [id number]- Traction (mechanical), [phone] (1-2 muscles), [id number] (3+ muscles)- Dry Needling, Patient/Family education, Taping, Joint mobilization, Joint manipulation, Spinal manipulation, Spinal mobilization, Scar mobilization, Compression bandaging, Cryotherapy, Moist heat, and Biofeedback.  PLAN FOR NEXT SESSION:    Review HEP Thoracic mobility therex.  Manual for pain management  TDN for traps and parascapular muscles   Shone Leventhal, PT 03/19/2024, 9:02 AM  "

## 2024-04-01 ENCOUNTER — Ambulatory Visit
